# Patient Record
Sex: Male | Born: 1965 | Race: White | Hispanic: No | Marital: Single | Smoking: Never smoker
Health system: Southern US, Community
[De-identification: ages and names within clinical notes are randomized; demographics above are authoritative.]

## PROBLEM LIST (undated history)

## (undated) DIAGNOSIS — G4730 Sleep apnea, unspecified: Secondary | ICD-10-CM

## (undated) DIAGNOSIS — Z1589 Genetic susceptibility to other disease: Secondary | ICD-10-CM

## (undated) DIAGNOSIS — T8859XA Other complications of anesthesia, initial encounter: Secondary | ICD-10-CM

## (undated) DIAGNOSIS — E785 Hyperlipidemia, unspecified: Secondary | ICD-10-CM

## (undated) DIAGNOSIS — I10 Essential (primary) hypertension: Secondary | ICD-10-CM

## (undated) HISTORY — PX: TMJ ARTHROSCOPY: SHX1067

## (undated) HISTORY — PX: COLONOSCOPY: SHX174

## (undated) HISTORY — DX: Sleep apnea, unspecified: G47.30

## (undated) HISTORY — PX: EYE SURGERY: SHX253

## (undated) HISTORY — DX: Essential (primary) hypertension: I10

## (undated) HISTORY — PX: WISDOM TOOTH EXTRACTION: SHX21

---

## 2003-02-27 ENCOUNTER — Encounter: Admission: RE | Admit: 2003-02-27 | Discharge: 2003-02-27 | Payer: Self-pay | Admitting: Family Medicine

## 2003-02-27 ENCOUNTER — Encounter: Payer: Self-pay | Admitting: Family Medicine

## 2014-03-24 ENCOUNTER — Ambulatory Visit (INDEPENDENT_AMBULATORY_CARE_PROVIDER_SITE_OTHER): Payer: BC Managed Care – PPO | Admitting: Family Medicine

## 2014-03-24 ENCOUNTER — Encounter: Payer: Self-pay | Admitting: Family Medicine

## 2014-03-24 VITALS — BP 120/84 | HR 82 | Temp 98.5°F | Resp 16

## 2014-03-24 DIAGNOSIS — T63461A Toxic effect of venom of wasps, accidental (unintentional), initial encounter: Secondary | ICD-10-CM

## 2014-03-24 DIAGNOSIS — T6391XA Toxic effect of contact with unspecified venomous animal, accidental (unintentional), initial encounter: Secondary | ICD-10-CM

## 2014-03-24 DIAGNOSIS — I10 Essential (primary) hypertension: Secondary | ICD-10-CM

## 2014-03-24 DIAGNOSIS — T7840XA Allergy, unspecified, initial encounter: Secondary | ICD-10-CM

## 2014-03-24 DIAGNOSIS — R0789 Other chest pain: Secondary | ICD-10-CM

## 2014-03-24 DIAGNOSIS — T63441A Toxic effect of venom of bees, accidental (unintentional), initial encounter: Secondary | ICD-10-CM

## 2014-03-24 DIAGNOSIS — L509 Urticaria, unspecified: Secondary | ICD-10-CM

## 2014-03-24 MED ORDER — PREDNISONE 20 MG PO TABS: ORAL_TABLET | ORAL | Status: DC

## 2014-03-24 MED ORDER — RANITIDINE HCL 150 MG PO TABS
150.0000 mg | ORAL_TABLET | Freq: Once | ORAL | Status: AC
  Administered 2014-03-24: 150 mg via ORAL

## 2014-03-24 MED ORDER — DIPHENHYDRAMINE HCL 25 MG PO CAPS
50.0000 mg | ORAL_CAPSULE | Freq: Once | ORAL | Status: AC
  Administered 2014-03-24: 50 mg via ORAL

## 2014-03-24 MED ORDER — METHYLPREDNISOLONE SODIUM SUCC 125 MG IJ SOLR
125.0000 mg | Freq: Once | INTRAMUSCULAR | Status: AC
  Administered 2014-03-24: 125 mg via INTRAVENOUS

## 2014-03-24 MED ORDER — EPINEPHRINE 0.3 MG/0.3ML IJ SOAJ: 0.3000 mg | Freq: Once | INTRAMUSCULAR | Status: DC

## 2014-03-24 NOTE — Progress Notes (Signed)
Subjective:  This chart was scribed for Reginia Forts, MD by Donato Schultz, Medical Scribe. This patient was seen in Room 6 and the patient's care was started at 3:48 PM.   Patient ID: Anthony Golden, male    DOB: 1966-09-28, 48 y.o.   MRN: [id number]  03/24/2014  Insect Bite, Chest Pain and Shortness of Breath   HPI HPI Comments: Anthony Golden is a 48 y.o. male who presents to the [facility] complaining of gradually worsening SOB and feeling as though his throat is tightening that started 10 minutes ago today after the patient was stung by a bee on the left side of his neck and right hand.  He denies taking any medication PTA.  The patient states that he has an allergy to honey bee venom and was stung in the past with a significant local reaction but without anaphylaxis or respiratory distress.  He states that in the past he has only experienced swelling to the site of the bee sting.  He states that he has never experienced these symptoms in the past.  The patient denies having any other allergies.  He denies having a Epi-pen.    PCP:  Nancy Fetter  Review of Systems  Constitutional: Positive for diaphoresis.  HENT: Positive for trouble swallowing. Negative for facial swelling.   Respiratory: Positive for chest tightness and shortness of breath. Negative for cough, choking, wheezing and stridor.   Cardiovascular: Negative for palpitations.  Gastrointestinal: Negative for nausea.  Skin: Positive for color change, rash and wound. Negative for pallor.  Neurological: Negative for dizziness, light-headedness and headaches.  All other systems reviewed and are negative.   Past Medical History  Diagnosis Date  . Hypertension    Past Surgical History  Procedure Laterality Date  . Tmj arthroscopy      Allergies not on file Current Outpatient Prescriptions  Medication Sig Dispense Refill  . EPINEPHrine (EPIPEN) 0.3 mg/0.3 mL IJ SOAJ injection Inject 0.3 mLs (0.3 mg total)  into the muscle once.  2 Device  2  . predniSONE (DELTASONE) 20 MG tablet Three tablets daily x 2 days then two tablets daily x 5 days then one tablet daily x 5 days  18 tablet  0   Current Facility-Administered Medications  Medication Dose Route Frequency Provider Last Rate Last Dose  . diphenhydrAMINE (BENADRYL) capsule 50 mg  50 mg Oral Once [employer], PA-C      . ranitidine (ZANTAC) tablet 150 mg  150 mg Oral Once Theda Sers, PA-C       History   Social History  . Marital Status: Single    Spouse Name: N/A    Number of Children: N/A  . Years of Education: N/A   Occupational History  . Not on file.   Social History Main Topics  . Smoking status: Not on file  . Smokeless tobacco: Not on file  . Alcohol Use: Yes  . Drug Use: No  . Sexual Activity: Yes   Other Topics Concern  . Not on file   Social History Narrative  . No narrative on file      Objective:    BP 118/80  Pulse 99  Resp 22  SpO2 94%  Physical Exam  Nursing note and vitals reviewed. Constitutional: He is oriented to person, place, and time. He appears well-developed and well-nourished. No distress.  HENT:  Head: Normocephalic and atraumatic.  Right Ear: External ear normal.  Left Ear: External ear  normal.  Nose: Nose normal.  Mouth/Throat: Oropharynx is clear and moist and mucous membranes are normal. No oral lesions. No oropharyngeal exudate or posterior oropharyngeal edema.  Eyes: Conjunctivae and EOM are normal. Pupils are equal, round, and reactive to light.  Neck: Normal range of motion. Neck supple. No tracheal deviation present.  Cardiovascular: Normal rate, regular rhythm and normal heart sounds.  Exam reveals no gallop and no friction rub.   No murmur heard. Pulmonary/Chest: Effort normal and breath sounds normal. No accessory muscle usage or stridor. Not tachypneic. No respiratory distress. He has no decreased breath sounds. He has no wheezes. He has no rales.  Musculoskeletal:  Normal range of motion.  Neurological: He is alert and oriented to person, place, and time.  Skin: Skin is warm. Rash noted. He is diaphoretic. There is erythema.  Scattered urticarial rash on dorsal aspects of B feet and B axilla; scattered urticaria along abdomen, legs, lateral L neck.  Surrounding swelling at L lateral neck region.  Psychiatric: He has a normal mood and affect. His behavior is normal.   Gave Benadryl 50MG  PO at 3:50 PM.  Ranitidine 150 PO at 3:50 PM.  Zyrtec 10MG  PO at 3:50 PM. IV placement at 3:58 PM by Luetta Nutting.  Solumedrol 172mL iv push at 4:00 PM.    Vitals at 4:20pm:  P 99  R 22  BP 118/80  Pulse oximetry: 94%.  Patient denies SOB, throat swelling, tongue swelling, difficulties breathing.  Itching more diffusely.    Re-evaluation at 4:45pm:  Tongue without swelling; throat without swelling; lungs CTAB; no wheezing; no tachypnea.  Hives decreased; facial redness nearly resolved.   Reevaluation at 5:30pm:  Tongue without swelling; throat without swelling. Lungs CTAB; no wheezing; no tachypnea.  Hives have resolved 95%; facial redness resolved.    No results found for this or any previous visit.  Assessment & Plan:  Allergic reaction, initial encounter - Plan: diphenhydrAMINE (BENADRYL) capsule 50 mg, ranitidine (ZANTAC) tablet 150 mg, methylPREDNISolone sodium succinate (SOLU-MEDROL) 125 mg/2 mL injection 125 mg  Bee sting reaction, accidental or unintentional, initial encounter  Essential hypertension, benign   1.  Allergic Reaction to bee sting:  New.  Evaluated urgently in office due to chest tightness, SOB, hives. S/p Solumedrol 125mg  iv push in office; s/p oral Benadryl 50mg  po, Ranitidine 150mg  po, Zyrtec 10mg  po in office.  Rx for Prednisone taper, Epipen provided.  Recommend continued Zyrtec, Benadryl, Ranitidine for upcoming week.  2.  Bee sting reaction initial encounter:  New.  S/p treatment as outlined above.  Rx for Epipen provided. 3.  HTN:  stable; maintained on Lisinopril.   Meds ordered this encounter  Medications  . diphenhydrAMINE (BENADRYL) capsule 50 mg    Sig:   . ranitidine (ZANTAC) tablet 150 mg    Sig:   . methylPREDNISolone sodium succinate (SOLU-MEDROL) 125 mg/2 mL injection 125 mg    Sig:   . EPINEPHrine (EPIPEN) 0.3 mg/0.3 mL IJ SOAJ injection    Sig: Inject 0.3 mLs (0.3 mg total) into the muscle once.    Dispense:  2 Device    Refill:  2  . predniSONE (DELTASONE) 20 MG tablet    Sig: Three tablets daily x 2 days then two tablets daily x 5 days then one tablet daily x 5 days    Dispense:  18 tablet    Refill:  0    No Follow-up on file.   I personally performed the services described in this documentation, which  was scribed in my presence.  The recorded information has been reviewed and is accurate.  Reginia Forts, M.D.  [facility] [address] [place], [place]  [phone] phone [phone] fax

## 2014-03-24 NOTE — Patient Instructions (Addendum)
1.  TAKE ZYRTEC 10MG or CLARITIN 10MG ONE TABLET DAILY. 2. TAKE BENADRYL 25MG ONE TABLET FOUR TIMES DAILY AS NEEDED FOR ITCHING 3.  TAKE RANITIDINE 150MG ONE TABLET TWICE DAILY 4. IF YOU BECOME SHORT OF BREATH, HAVE THROAT SWELLING OR TONGUE SWELLING, ADMINISTER EPIPEN AND CALL 911.  Bee, Wasp, or Hornet Sting Your caregiver has diagnosed you as having an insect sting. An insect sting appears as a red lump in the skin that sometimes has a tiny hole in the center, or it may have a stinger in the center of the wound. The most common stings are from wasps, hornets and bees. Individuals have different reactions to insect stings.  A normal reaction may cause pain, swelling, and redness around the sting site.  A localized allergic reaction may cause swelling and redness that extends beyond the sting site.  A large local reaction may continue to develop over the next 12 to 36 hours.  On occasion, the reactions can be severe (anaphylactic reaction). An anaphylactic reaction may cause wheezing; difficulty breathing; chest pain; fainting; raised, itchy, red patches on the skin; a sick feeling to your stomach (nausea); vomiting; cramping; or diarrhea. If you have had an anaphylactic reaction to an insect sting in the past, you are more likely to have one again. HOME CARE INSTRUCTIONS   With bee stings, a small sac of poison is left in the wound. Brushing across this with something such as a credit card, or anything similar, will help remove this and decrease the amount of the reaction. This same procedure will not help a wasp sting as they do not leave behind a stinger and poison sac.  Apply a cold compress for 10 to 20 minutes every hour for 1 to 2 days, depending on severity, to reduce swelling and itching.  To lessen pain, a paste made of water and baking soda may be rubbed on the bite or sting and left on for 5 minutes.  To relieve itching and swelling, you may use take medication or apply medicated  creams or lotions as directed.  Only take over-the-counter or prescription medicines for pain, discomfort, or fever as directed by your caregiver.  Wash the sting site daily with soap and water. Apply antibiotic ointment on the sting site as directed.  If you suffered a severe reaction:  If you did not require hospitalization, an adult will need to stay with you for 24 hours in case the symptoms return.  You may need to wear a medical bracelet or necklace stating the allergy.  You and your family need to learn when and how to use an anaphylaxis kit or epinephrine injection.  If you have had a severe reaction before, always carry your anaphylaxis kit with you. SEEK MEDICAL CARE IF:   None of the above helps within 2 to 3 days.  The area becomes red, warm, tender, and swollen beyond the area of the bite or sting.  You have an oral temperature above 102 F (38.9 C). SEEK IMMEDIATE MEDICAL CARE IF:  You have symptoms of an allergic reaction which are:  Wheezing.  Difficulty breathing.  Chest pain.  Lightheadedness or fainting.  Itchy, raised, red patches on the skin.  Nausea, vomiting, cramping or diarrhea. ANY OF THESE SYMPTOMS MAY REPRESENT A SERIOUS PROBLEM THAT IS AN EMERGENCY. Do not wait to see if the symptoms will go away. Get medical help right away. Call your local emergency services (911 in [place] DO NOT drive yourself to the hospital.  MAKE SURE YOU:   Understand these instructions.  Will watch your condition.  Will get help right away if you are not doing well or get worse. Document Released: 09/14/2005 Document Revised: 12/07/2011 Document Reviewed: 03/01/2010 [facility] Patient Information 2015 [place], [place]. This information is not intended to replace advice given to you by your health care provider. Make sure you discuss any questions you have with your health care provider.

## 2016-01-15 DIAGNOSIS — E785 Hyperlipidemia, unspecified: Secondary | ICD-10-CM | POA: Diagnosis not present

## 2016-01-15 DIAGNOSIS — I10 Essential (primary) hypertension: Secondary | ICD-10-CM | POA: Diagnosis not present

## 2016-03-30 DIAGNOSIS — R079 Chest pain, unspecified: Secondary | ICD-10-CM | POA: Diagnosis not present

## 2016-03-30 DIAGNOSIS — I10 Essential (primary) hypertension: Secondary | ICD-10-CM | POA: Diagnosis not present

## 2016-07-20 DIAGNOSIS — E785 Hyperlipidemia, unspecified: Secondary | ICD-10-CM | POA: Diagnosis not present

## 2016-07-20 DIAGNOSIS — Z125 Encounter for screening for malignant neoplasm of prostate: Secondary | ICD-10-CM | POA: Diagnosis not present

## 2016-07-20 DIAGNOSIS — Z0000 Encounter for general adult medical examination without abnormal findings: Secondary | ICD-10-CM | POA: Diagnosis not present

## 2016-07-20 DIAGNOSIS — I10 Essential (primary) hypertension: Secondary | ICD-10-CM | POA: Diagnosis not present

## 2016-07-20 DIAGNOSIS — N50812 Left testicular pain: Secondary | ICD-10-CM | POA: Diagnosis not present

## 2016-11-02 DIAGNOSIS — M778 Other enthesopathies, not elsewhere classified: Secondary | ICD-10-CM | POA: Diagnosis not present

## 2017-04-13 DIAGNOSIS — E785 Hyperlipidemia, unspecified: Secondary | ICD-10-CM | POA: Diagnosis not present

## 2017-04-13 DIAGNOSIS — I10 Essential (primary) hypertension: Secondary | ICD-10-CM | POA: Diagnosis not present

## 2017-06-01 DIAGNOSIS — F439 Reaction to severe stress, unspecified: Secondary | ICD-10-CM | POA: Diagnosis not present

## 2017-06-01 DIAGNOSIS — I10 Essential (primary) hypertension: Secondary | ICD-10-CM | POA: Diagnosis not present

## 2017-12-02 DIAGNOSIS — E785 Hyperlipidemia, unspecified: Secondary | ICD-10-CM | POA: Diagnosis not present

## 2017-12-02 DIAGNOSIS — E669 Obesity, unspecified: Secondary | ICD-10-CM | POA: Diagnosis not present

## 2017-12-02 DIAGNOSIS — I10 Essential (primary) hypertension: Secondary | ICD-10-CM | POA: Diagnosis not present

## 2018-02-24 DIAGNOSIS — R0789 Other chest pain: Secondary | ICD-10-CM | POA: Diagnosis not present

## 2018-02-24 DIAGNOSIS — F419 Anxiety disorder, unspecified: Secondary | ICD-10-CM | POA: Diagnosis not present

## 2018-02-24 DIAGNOSIS — H532 Diplopia: Secondary | ICD-10-CM | POA: Diagnosis not present

## 2018-03-17 DIAGNOSIS — R945 Abnormal results of liver function studies: Secondary | ICD-10-CM | POA: Diagnosis not present

## 2018-04-03 DIAGNOSIS — K219 Gastro-esophageal reflux disease without esophagitis: Secondary | ICD-10-CM | POA: Diagnosis not present

## 2018-05-08 NOTE — Progress Notes (Signed)
Cardiology Office Note   Date:  05/10/2018   ID:  Anthony Golden, DOB 1966-04-10, MRN [id number]  PCP:  Donald Prose, MD  Cardiologist:   No primary care provider on file. Referring:  Donald Prose, MD  Chief Complaint  Patient presents with  . New Patient (Initial Visit)    no current chest pains, does mention numbness and tingling in left arm.      History of Present Illness: Anthony Golden is a 52 y.o. male who is referred by Donald Prose, MD for evaluation of chest pain.  The patient reports having had a stress test years ago but otherwise no past cardiac history.  About 3 months ago he had some chest discomfort.  This was happening sporadically.  He was under significant stress. His stop son died and he was going through a divorce.  He works two jobs.  He was given some Xanax and he said this actually helped with his discomfort.  It helped him sleep a little bit better.  He has very poor sleep hygiene and he said as he got a little bit better with this he says his symptoms improved.  He has not had chest pain in about 2-1/2 months.  When he did have it it was a left-sided discomfort.  It is dull.  It would last for about 10 minutes.  It was 3 out of 10.  It would occur sporadically and go away spontaneously.  There are no associated symptoms such as nausea vomiting diaphoresis.  He did have any palpitations, presyncope or syncope.  Past Medical History:  Diagnosis Date  . Hypertension   . Sleep apnea    Does not use CPAP    Past Surgical History:  Procedure Laterality Date  . TMJ ARTHROSCOPY       Current Outpatient Medications  Medication Sig Dispense Refill  . ALPRAZolam (XANAX) 0.25 MG tablet TK 1 T PO  BID  0  . lisinopril-hydrochlorothiazide (PRINZIDE,ZESTORETIC) 20-12.5 MG tablet TK 1 T PO QD  1   No current facility-administered medications for this visit.     Allergies:   Bee venom    Social History:  The patient  reports that he has never smoked. He has never  used smokeless tobacco. He reports that he drinks alcohol. He reports that he does not use drugs.   Family History:  The patient's family history includes COPD in his mother; Cancer in his mother; Heart disease in his father.    ROS:  Please see the history of present illness.   Otherwise, review of systems are positive for none.   All other systems are reviewed and negative.    PHYSICAL EXAM: VS:  BP 128/80 (BP Location: Left Arm, Patient Position: Sitting)   Pulse 95   Ht 5\' 10"  (1.778 m)   Wt 213 lb 12.8 oz (97 kg)   BMI 30.68 kg/m  , BMI Body mass index is 30.68 kg/m. GENERAL:  Well appearing HEENT:  Pupils equal round and reactive, fundi not visualized, oral mucosa unremarkable NECK:  No jugular venous distention, waveform within normal limits, carotid upstroke brisk and symmetric, no bruits, no thyromegaly LYMPHATICS:  No cervical, inguinal adenopathy LUNGS:  Clear to auscultation bilaterally BACK:  No CVA tenderness CHEST:  Unremarkable HEART:  PMI not displaced or sustained,S1 and S2 within normal limits, no S3, no S4, no clicks, no rubs, no murmurs ABD:  Flat, positive bowel sounds normal in frequency in pitch, no bruits, no  rebound, no guarding, no midline pulsatile mass, no hepatomegaly, no splenomegaly EXT:  2 plus pulses throughout, no edema, no cyanosis no clubbing SKIN:  No rashes no nodules NEURO:  Cranial nerves II through XII grossly intact, motor grossly intact throughout PSYCH:  Cognitively intact, oriented to person place and time    EKG:  EKG is ordered today. The ekg ordered today demonstrates sinus rhythm, rate 95, left axis deviation, cannot exclude old inferior MI, borderline QT prolongation.   Recent Labs: No results found for requested labs within last 8760 hours.    Lipid Panel No results found for: CHOL, TRIG, HDL, CHOLHDL, VLDL, LDLCALC, LDLDIRECT    Wt Readings from Last 3 Encounters:  05/10/18 213 lb 12.8 oz (97 kg)      Other studies  Reviewed: Additional studies/ records that were reviewed today include: Labs. Review of the above records demonstrates:  Please see elsewhere in the note.     ASSESSMENT AND PLAN:  CHEST PAIN: His pain is atypical.  However, he has significant cardiovascular risk factors.  I am going to start with a coronary calcium score.  HTN:  The blood pressure is at target. No change in medications is indicated. We will continue with therapeutic lifestyle changes (TLC).  ANXIETY:  We talked about the importance of stress management.    Current medicines are reviewed at length with the patient today.  The patient does not have concerns regarding medicines.  The following changes have been made:  no change  Labs/ tests ordered today include:   Orders Placed This Encounter  Procedures  . CT CARDIAC SCORING     Disposition:   FU with me as needed.      Signed, Minus Breeding, MD  05/10/2018 5:06 PM    [facility]

## 2018-05-10 ENCOUNTER — Ambulatory Visit: Payer: Federal, State, Local not specified - PPO | Admitting: Cardiology

## 2018-05-10 ENCOUNTER — Encounter: Payer: Self-pay | Admitting: Cardiology

## 2018-05-10 VITALS — BP 128/80 | HR 95 | Ht 70.0 in | Wt 213.8 lb

## 2018-05-10 DIAGNOSIS — I10 Essential (primary) hypertension: Secondary | ICD-10-CM

## 2018-05-10 DIAGNOSIS — R079 Chest pain, unspecified: Secondary | ICD-10-CM

## 2018-05-10 NOTE — Patient Instructions (Signed)
Medication Instructions:  Continue current medications  If you need a refill on your cardiac medications before your next appointment, please call your pharmacy.  Labwork: None Ordered   Testing/Procedures: Your physician has requested that you have a Coronary Calcium Score Test. This test is done at our [facility].   Follow-Up: Your physician wants you to follow-up in: As Needed.      Thank you for choosing [facility] at [facility]!!

## 2018-05-12 ENCOUNTER — Other Ambulatory Visit (INDEPENDENT_AMBULATORY_CARE_PROVIDER_SITE_OTHER): Payer: Federal, State, Local not specified - PPO

## 2018-05-12 DIAGNOSIS — R079 Chest pain, unspecified: Secondary | ICD-10-CM

## 2018-05-27 ENCOUNTER — Ambulatory Visit: Payer: Self-pay

## 2018-05-27 ENCOUNTER — Other Ambulatory Visit: Payer: Self-pay | Admitting: Occupational Medicine

## 2018-05-27 DIAGNOSIS — M79644 Pain in right finger(s): Secondary | ICD-10-CM

## 2018-06-03 ENCOUNTER — Inpatient Hospital Stay: Admission: RE | Admit: 2018-06-03 | Payer: Federal, State, Local not specified - PPO | Source: Ambulatory Visit

## 2018-06-03 ENCOUNTER — Ambulatory Visit (INDEPENDENT_AMBULATORY_CARE_PROVIDER_SITE_OTHER)
Admission: RE | Admit: 2018-06-03 | Discharge: 2018-06-03 | Disposition: A | Discharge status: Home / Self Care | Payer: Federal, State, Local not specified - PPO | Source: Ambulatory Visit | Attending: Cardiology | Admitting: Cardiology

## 2018-06-03 DIAGNOSIS — R079 Chest pain, unspecified: Secondary | ICD-10-CM

## 2018-06-07 ENCOUNTER — Telehealth: Payer: Self-pay | Admitting: *Deleted

## 2018-06-07 DIAGNOSIS — Z79899 Other long term (current) drug therapy: Secondary | ICD-10-CM

## 2018-06-07 DIAGNOSIS — R931 Abnormal findings on diagnostic imaging of heart and coronary circulation: Secondary | ICD-10-CM

## 2018-06-07 MED ORDER — PRAVASTATIN SODIUM 40 MG PO TABS: 40.0000 mg | ORAL_TABLET | Freq: Every evening | ORAL | 3 refills | Status: DC

## 2018-06-07 NOTE — Telephone Encounter (Signed)
GXT ordered and send to scheduler to be schedule, Rx has been sent to the pharmacy electronically. Pravastatin 40 mg #90 3 refills Fasting lipids and liver ordered and mailed to pt

## 2018-06-07 NOTE — Telephone Encounter (Signed)
-----   Message from Minus Breeding, MD sent at 06/06/2018 11:35 AM EDT ----- Called to discuss the elevated coronary calcium. Needs POET (Plain Old Exercise Treadmill).  Please call to scheduled.  MESA score is greater than 13% (intermediate risk).  I would suggest a goal LDL of less than 70 and he agrees to start a statin.  Please call in Pravastatin 40 mg po qhs.  Disp number 90 with 3 refills.  Repeat lipid profile in 8 weeks.  Send results to Donald Prose, MD

## 2018-06-08 ENCOUNTER — Telehealth (HOSPITAL_COMMUNITY): Payer: Self-pay

## 2018-06-08 NOTE — Telephone Encounter (Signed)
Encounter complete. 

## 2018-06-09 ENCOUNTER — Ambulatory Visit (HOSPITAL_COMMUNITY)
Admission: RE | Admit: 2018-06-09 | Discharge: 2018-06-09 | Disposition: A | Discharge status: Home / Self Care | Payer: Federal, State, Local not specified - PPO | Source: Ambulatory Visit | Attending: Cardiology | Admitting: Cardiology

## 2018-06-09 DIAGNOSIS — R931 Abnormal findings on diagnostic imaging of heart and coronary circulation: Secondary | ICD-10-CM | POA: Diagnosis not present

## 2018-06-10 LAB — EXERCISE TOLERANCE TEST
CHL RATE OF PERCEIVED EXERTION: 17
CSEPED: 9 min
Estimated workload: 11.6 METS
Exercise duration (sec): 53 s
MPHR: 166 {beats}/min
Peak HR: 151 {beats}/min
Percent HR: 90 %
Rest HR: 93 {beats}/min

## 2018-06-14 DIAGNOSIS — I10 Essential (primary) hypertension: Secondary | ICD-10-CM | POA: Diagnosis not present

## 2018-08-17 NOTE — Progress Notes (Signed)
Cardiology Office Note   Date:  08/18/2018   ID:  Anthony Golden, DOB 03-05-1966, MRN [id number]  PCP:  Donald Prose, MD  Cardiologist:   No primary care provider on file. Referring:  Donald Prose, MD   Chief Complaint  Patient presents with  . ELEVATED CORONARY CALCIUM      History of Present Illness: Anthony Golden is a 52 y.o. male who is referred by Donald Prose, MD for evaluation of chest pain.   When I saw him he had an elevated coronary calcium and he had a negative POET (Plain Old Exercise Treadmill).  He returns for follow-up.  He was going through the stress of the divorce which is now behind him.  He works cutting down trees.  He is no longer getting the chest discomfort that he was having.  He is not having any palpitations, presyncope or syncope.  He has not had any edema.  Unfortunately he is gained some weight.  This is above his usual winter baseline.   Past Medical History:  Diagnosis Date  . Hypertension   . Sleep apnea    Does not use CPAP    Past Surgical History:  Procedure Laterality Date  . TMJ ARTHROSCOPY       Current Outpatient Medications  Medication Sig Dispense Refill  . ALPRAZolam (XANAX) 0.25 MG tablet TK 1 T PO  BID  0  . lisinopril-hydrochlorothiazide (PRINZIDE,ZESTORETIC) 20-12.5 MG tablet TK 1 T PO QD  1  . pravastatin (PRAVACHOL) 40 MG tablet Take 1 tablet (40 mg total) by mouth every evening. 90 tablet 3   No current facility-administered medications for this visit.     Allergies:   Bee venom    Social History:  The patient  reports that he has never smoked. He has never used smokeless tobacco. He reports that he drinks alcohol. He reports that he does not use drugs.   Family History:  The patient's family history includes COPD in his mother; Cancer in his mother; Heart disease in his father.    ROS:  Please see the history of present illness.   Otherwise, review of systems are positive for none.   All other systems are  reviewed and negative.    PHYSICAL EXAM: VS:  BP (!) 144/96   Pulse 97   Ht 5\' 10"  (1.778 m)   Wt 243 lb 3.2 oz (110.3 kg)   SpO2 95%   BMI 34.90 kg/m  , BMI Body mass index is 34.9 kg/m. GENERAL:  Well appearing HEENT:  Pupils equal round and reactive, fundi not visualized, oral mucosa unremarkable NECK:  No jugular venous distention, waveform within normal limits, carotid upstroke brisk and symmetric, no bruits, no thyromegaly LYMPHATICS:  No cervical, inguinal adenopathy LUNGS:  Clear to auscultation bilaterally BACK:  No CVA tenderness CHEST:  Unremarkable HEART:  PMI not displaced or sustained,S1 and S2 within normal limits, no S3, no S4, no clicks, no rubs, no murmurs ABD:  Flat, positive bowel sounds normal in frequency in pitch, no bruits, no rebound, no guarding, no midline pulsatile mass, no hepatomegaly, no splenomegaly EXT:  2 plus pulses throughout, no edema, no cyanosis no clubbing SKIN:  No rashes no nodules NEURO:  Cranial nerves II through XII grossly intact, motor grossly intact throughout PSYCH:  Cognitively intact, oriented to person place and time    EKG:  EKG is ordered today. The ekg ordered today demonstrates sinus rhythm, rate 95, left axis deviation, cannot exclude  old inferior MI, borderline QT prolongation.   Recent Labs: No results found for requested labs within last 8760 hours.    Lipid Panel No results found for: CHOL, TRIG, HDL, CHOLHDL, VLDL, LDLCALC, LDLDIRECT    Wt Readings from Last 3 Encounters:  08/18/18 243 lb 3.2 oz (110.3 kg)  05/10/18 213 lb 12.8 oz (97 kg)      Other studies Reviewed: Additional studies/ records that were reviewed today include: Labs. Review of the above records demonstrates:  Please see elsewhere in the note.     ASSESSMENT AND PLAN:  CHEST PAIN:   The patient had atypical chest pain.  No further cardiovascular testing is suggested.  HTN:  The blood pressure is not at target.  I would like him to lose  weight and we talked very specifically about this and I think his blood pressure would come down to target with current meds.  ELEVATED CORONARY CALCIUM: We had a long discussion about this.  There was no suggestion of obstructive disease on his POET (Plain Old Exercise Treadmill).  No further testing is indicated.  He needs aggressive risk reduction.  DYSLIPIDEMIA: We discussed at length Mediterranean diet.  I previously started him on pravastatin.  I have him come back in the near future for a lipid profile.   Current medicines are reviewed at length with the patient today.  The patient does not have concerns regarding medicines.  The following changes have been made:  None  Labs/ tests ordered today include:   Orders Placed This Encounter  Procedures  . Lipid panel     Disposition:   FU with me in one year.    Signed, Minus Breeding, MD  08/18/2018 5:38 PM    [place]

## 2018-08-18 ENCOUNTER — Encounter: Payer: Self-pay | Admitting: Cardiology

## 2018-08-18 ENCOUNTER — Ambulatory Visit: Payer: Federal, State, Local not specified - PPO | Admitting: Cardiology

## 2018-08-18 VITALS — BP 144/96 | HR 97 | Ht 70.0 in | Wt 243.2 lb

## 2018-08-18 DIAGNOSIS — Z79899 Other long term (current) drug therapy: Secondary | ICD-10-CM | POA: Diagnosis not present

## 2018-08-18 NOTE — Patient Instructions (Signed)

## 2018-11-28 DIAGNOSIS — M7672 Peroneal tendinitis, left leg: Secondary | ICD-10-CM | POA: Diagnosis not present

## 2018-11-28 DIAGNOSIS — M84375A Stress fracture, left foot, initial encounter for fracture: Secondary | ICD-10-CM | POA: Diagnosis not present

## 2018-12-05 DIAGNOSIS — M84375D Stress fracture, left foot, subsequent encounter for fracture with routine healing: Secondary | ICD-10-CM | POA: Diagnosis not present

## 2018-12-05 DIAGNOSIS — Z125 Encounter for screening for malignant neoplasm of prostate: Secondary | ICD-10-CM | POA: Diagnosis not present

## 2018-12-05 DIAGNOSIS — E669 Obesity, unspecified: Secondary | ICD-10-CM | POA: Diagnosis not present

## 2018-12-05 DIAGNOSIS — E781 Pure hyperglyceridemia: Secondary | ICD-10-CM | POA: Diagnosis not present

## 2018-12-05 DIAGNOSIS — E785 Hyperlipidemia, unspecified: Secondary | ICD-10-CM | POA: Diagnosis not present

## 2018-12-05 DIAGNOSIS — Z0000 Encounter for general adult medical examination without abnormal findings: Secondary | ICD-10-CM | POA: Diagnosis not present

## 2018-12-05 DIAGNOSIS — F419 Anxiety disorder, unspecified: Secondary | ICD-10-CM | POA: Diagnosis not present

## 2018-12-05 DIAGNOSIS — I10 Essential (primary) hypertension: Secondary | ICD-10-CM | POA: Diagnosis not present

## 2018-12-19 DIAGNOSIS — M84375D Stress fracture, left foot, subsequent encounter for fracture with routine healing: Secondary | ICD-10-CM | POA: Diagnosis not present

## 2018-12-22 IMAGING — CT CT HEART SCORING
2 series · 16 of 20 positions shown, 18 images · non-contrast
Comparison: None.

EXAM:
OVER-READ INTERPRETATION  CT CHEST

The following report is an over-read performed by radiologist Dr.
Aphiwe J V Rensburg [REDACTED] on 06/04/2018. This over-read
does not include interpretation of cardiac or coronary anatomy or
pathology. The cardiac/coronary CT interpretation by the
cardiologist is attached.
CLINICAL DATA: Risk stratification
Coronary Calcium Score
TECHNIQUE: The patient was scanned on a Siemens Somatom 64 slice scanner. Axial
non-contrast 3mm slices were carried out through the heart. The data
set was analyzed on a dedicated work station and scored using the
Agatson method.

[Series 2: casc 3.0 i36f 2 bestdiast 67 % · axial · 0.44mm/px · z∈[-279,-174]mm · 8 of 45 slices shown, 10 images]
[im 5/45  vessel]
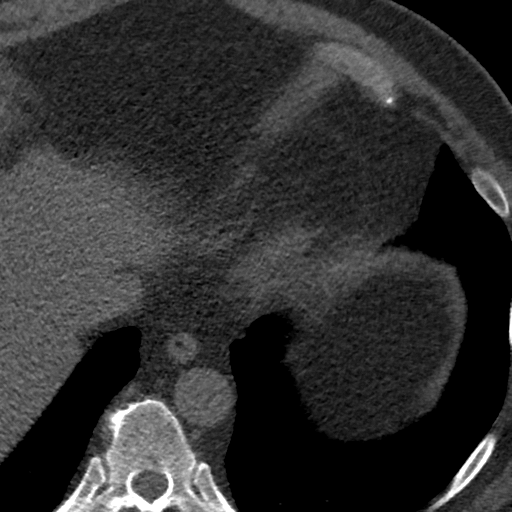
[im 5/45  lung]
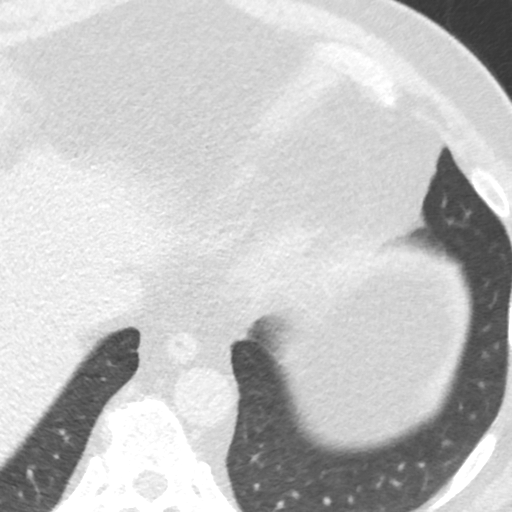
[im 10/45  vessel]
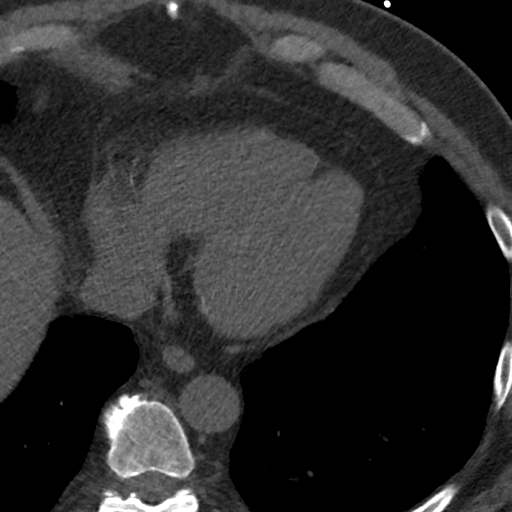
[im 15/45  vessel]
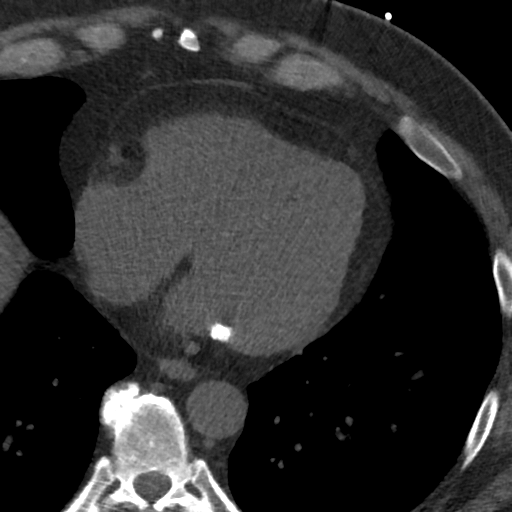
[im 20/45  vessel]
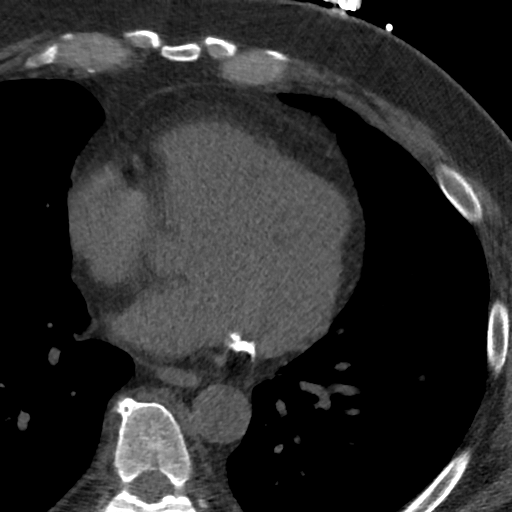
[im 25/45  vessel]
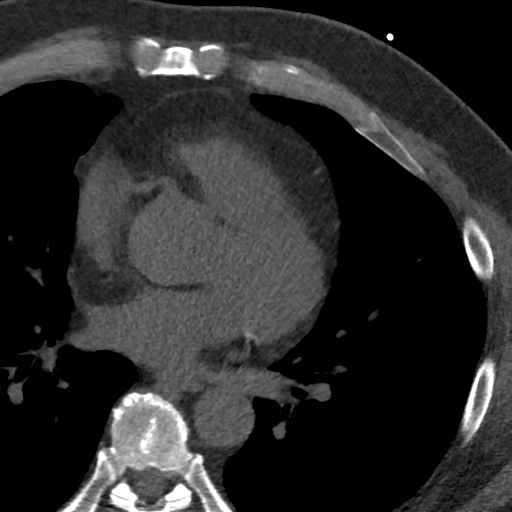
[im 25/45  lung]
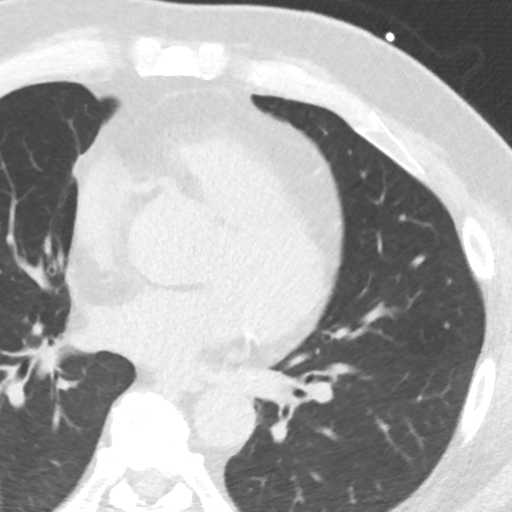
[im 30/45  vessel]
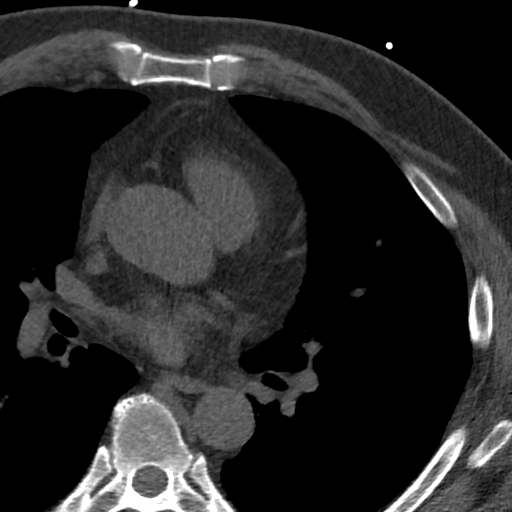
[im 35/45  vessel]
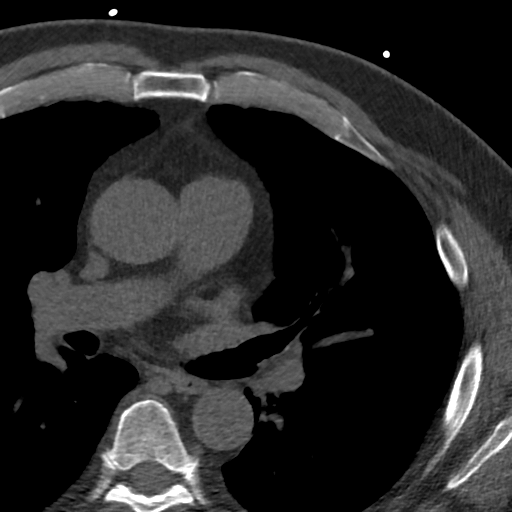
[im 40/45  vessel]
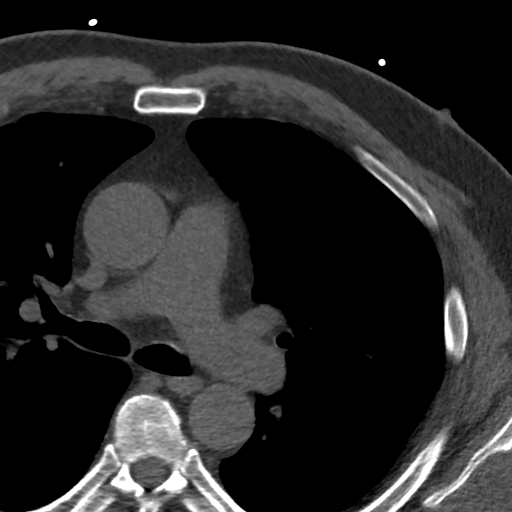

[Series 4: lung st 67 % · axial · 0.72mm/px · z∈[-279,-174]mm · 8 of 45 slices shown]
[im 5/45  lung]
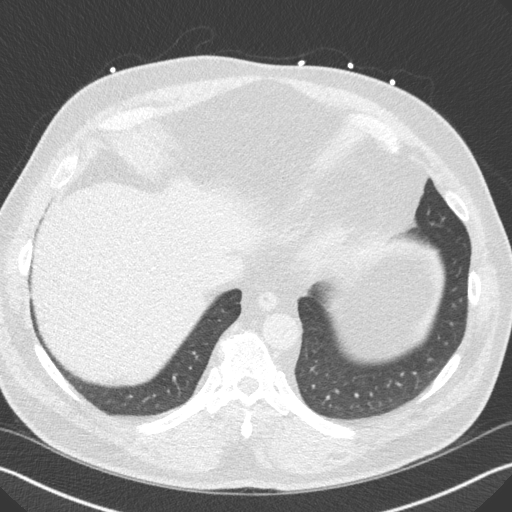
[im 10/45  lung]
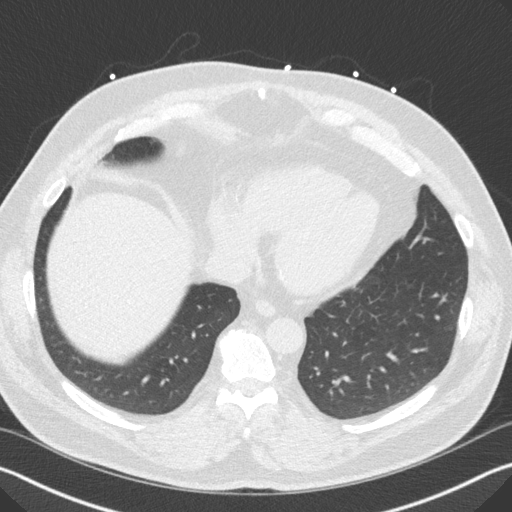
[im 15/45  lung]
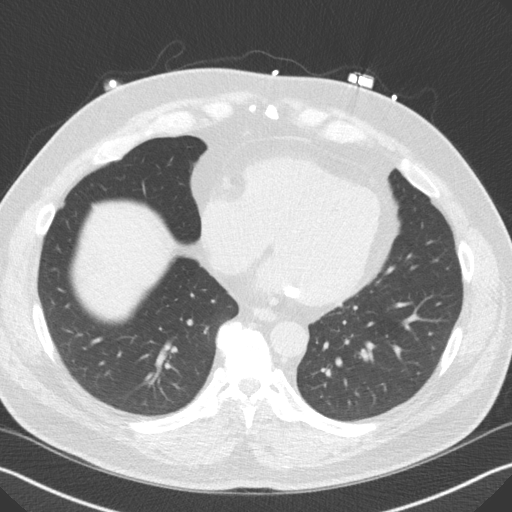
[im 20/45  lung]
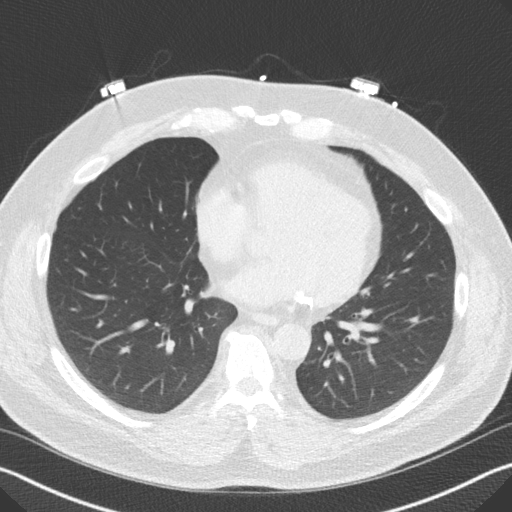
[im 25/45  lung]
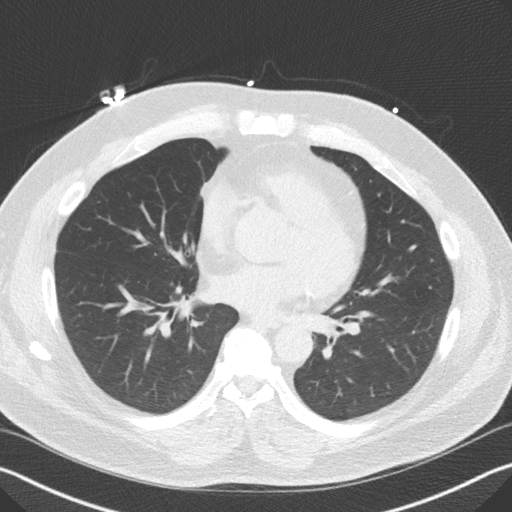
[im 30/45  lung]
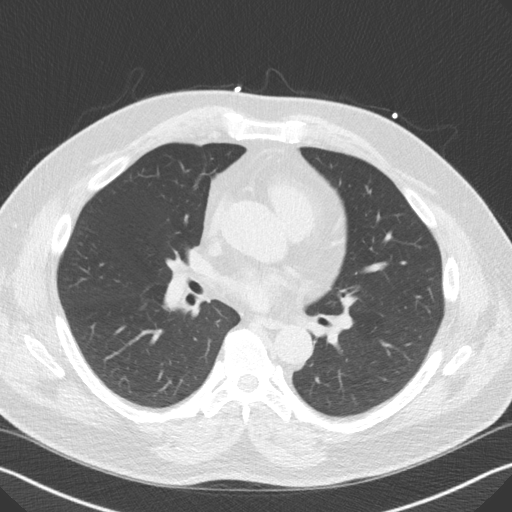
[im 35/45  lung]
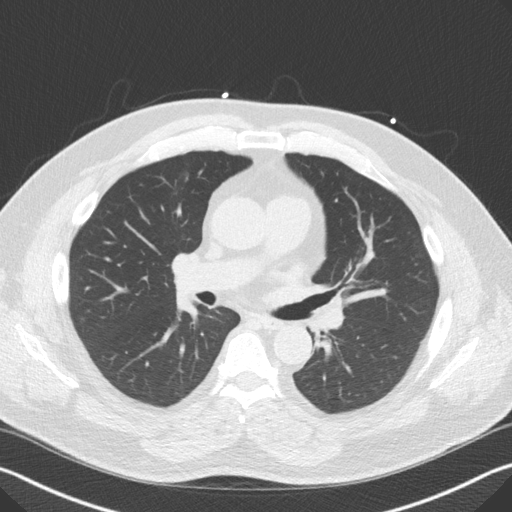
[im 40/45  lung]
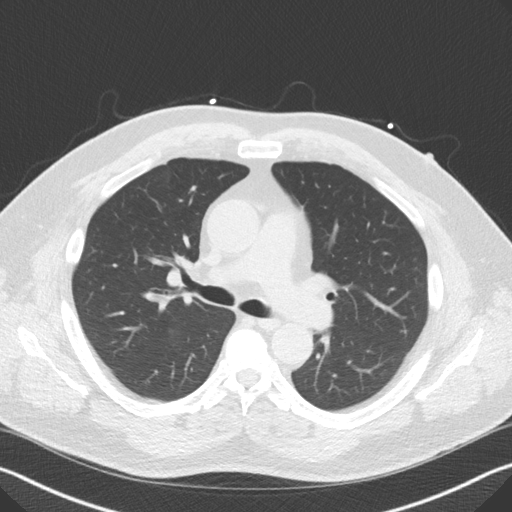

[16 of 20 positions shown; findings below may reference images not displayed]

FINDINGS: The visualized portions of the lower lung fields show no suspicious
nodules, masses, or infiltrates. The visualized portions of the
mediastinum and chest wall are unremarkable.
IMPRESSION: No significant non-cardiac abnormality in visualized portion of the
thorax.
FINDINGS: Non-cardiac: No significant non cardiac findings on limited lung and
soft tissue windows. See separate report from [REDACTED].

Ascending Aorta: Normal diameter 3.7 cm

Pericardium: Normal

Coronary arteries: Calcium noted in proximal and mid LAD, Distal
RCA. There was significant mitral annular calcification
IMPRESSION: Coronary calcium score of 232. This was 91 st percentile for age and
sex matched control.

Tonttu Sepp

## 2019-01-02 DIAGNOSIS — M84375D Stress fracture, left foot, subsequent encounter for fracture with routine healing: Secondary | ICD-10-CM | POA: Diagnosis not present

## 2019-01-24 DIAGNOSIS — E7800 Pure hypercholesterolemia, unspecified: Secondary | ICD-10-CM | POA: Diagnosis not present

## 2019-02-21 ENCOUNTER — Telehealth: Payer: Self-pay | Admitting: Cardiology

## 2019-02-21 NOTE — Telephone Encounter (Signed)
  Patient states that he had discussed going back on his cpap at last visit. He says he has already had a sleep study and just needs to get a cpap machine. He needs a new order for one.

## 2019-02-21 NOTE — Telephone Encounter (Signed)
LMTCB

## 2019-02-21 NOTE — Telephone Encounter (Signed)
Pt reports that Dr. Rex Kras that has managed his CPAP is retired and he does not have anyone to manage for him... he needs new order for new machine since his other is not working anymore... he has not been using it for quite a while.. last sleep study was 5 years ago... I asked him if he is seeing someone in Pulmonary and he is not and has not tried Dr. Eddie Dibbles office to see who has taken over for him.. I urged him to try and call his office since they will have the pertinent imformation needed for the machine.. he says he has discussed this with Dr. Percival Spanish in the oast and says he was certain he could call Dr. Percival Spanish for this need. I advised him I will forward to him for his recommendations and possibility of someone managing this for him in our practice.

## 2019-02-21 NOTE — Telephone Encounter (Signed)
Please refer to Dr. Claiborne Billings to assume management of his sleep apnea.

## 2019-02-22 NOTE — Telephone Encounter (Signed)
Ok to set up either office evaluation or virtual visit with me; try to get records from Dr. Eddie Dibbles office and data regarding his prior sleep study prior to the evaluation.

## 2019-02-22 NOTE — Telephone Encounter (Signed)
Pt agreed to referral to Dr. Claiborne Billings to manage his CPAP. Will forward msg.

## 2019-02-24 NOTE — Telephone Encounter (Signed)
We have opening for sleep clinic on August 5th at 8:20. Can we please schedule this [place]?   Also Lovena Le, can we reach out and get these records for Dr.Kelly?  Thank you!

## 2019-03-30 ENCOUNTER — Telehealth: Payer: Self-pay | Admitting: Cardiovascular Disease

## 2019-03-30 NOTE — Telephone Encounter (Signed)
Records requested from PCP office and are being faxed today.

## 2019-03-30 NOTE — Telephone Encounter (Signed)
Spoke to medical records. Records for pt are being faxed to our office for pt appt on 05/03/19 with Dr. Claiborne Billings.

## 2019-04-07 DIAGNOSIS — K5730 Diverticulosis of large intestine without perforation or abscess without bleeding: Secondary | ICD-10-CM | POA: Diagnosis not present

## 2019-04-07 DIAGNOSIS — D125 Benign neoplasm of sigmoid colon: Secondary | ICD-10-CM | POA: Diagnosis not present

## 2019-04-07 DIAGNOSIS — K640 First degree hemorrhoids: Secondary | ICD-10-CM | POA: Diagnosis not present

## 2019-04-07 DIAGNOSIS — D124 Benign neoplasm of descending colon: Secondary | ICD-10-CM | POA: Diagnosis not present

## 2019-04-07 DIAGNOSIS — Z800 Family history of malignant neoplasm of digestive organs: Secondary | ICD-10-CM | POA: Diagnosis not present

## 2019-04-07 DIAGNOSIS — Z1211 Encounter for screening for malignant neoplasm of colon: Secondary | ICD-10-CM | POA: Diagnosis not present

## 2019-05-02 NOTE — Telephone Encounter (Signed)
Records have been received from PCP--printed from [place]. Paper chart requested by Wilhelmina Mcardle in med rec. She stated may not come until Friday, but will have for Dr. Claiborne Billings to review when it comes.  Mariann Laster, CMA and Almyra Free, LPN have been informed.

## 2019-05-03 ENCOUNTER — Ambulatory Visit: Payer: Federal, State, Local not specified - PPO | Admitting: Cardiovascular Disease

## 2019-06-07 ENCOUNTER — Other Ambulatory Visit (HOSPITAL_COMMUNITY): Payer: Self-pay | Admitting: Licensed Clinical Social Worker

## 2019-06-08 ENCOUNTER — Other Ambulatory Visit: Payer: Self-pay | Admitting: Behavioral Health

## 2019-06-08 ENCOUNTER — Inpatient Hospital Stay (HOSPITAL_COMMUNITY)
Admission: RE | Admit: 2019-06-08 | Discharge: 2019-06-11 | DRG: 881 | Disposition: A | Discharge status: Home / Self Care | Payer: Federal, State, Local not specified - PPO | Attending: Psychiatry | Admitting: Psychiatry

## 2019-06-08 ENCOUNTER — Encounter (HOSPITAL_COMMUNITY): Payer: Self-pay

## 2019-06-08 ENCOUNTER — Other Ambulatory Visit: Payer: Self-pay

## 2019-06-08 DIAGNOSIS — F322 Major depressive disorder, single episode, severe without psychotic features: Secondary | ICD-10-CM | POA: Diagnosis not present

## 2019-06-08 DIAGNOSIS — Z20828 Contact with and (suspected) exposure to other viral communicable diseases: Secondary | ICD-10-CM | POA: Diagnosis not present

## 2019-06-08 DIAGNOSIS — E785 Hyperlipidemia, unspecified: Secondary | ICD-10-CM | POA: Diagnosis present

## 2019-06-08 DIAGNOSIS — M1990 Unspecified osteoarthritis, unspecified site: Secondary | ICD-10-CM | POA: Diagnosis not present

## 2019-06-08 DIAGNOSIS — R45851 Suicidal ideations: Secondary | ICD-10-CM | POA: Diagnosis present

## 2019-06-08 DIAGNOSIS — G4730 Sleep apnea, unspecified: Secondary | ICD-10-CM | POA: Diagnosis present

## 2019-06-08 DIAGNOSIS — I509 Heart failure, unspecified: Secondary | ICD-10-CM | POA: Diagnosis present

## 2019-06-08 DIAGNOSIS — G4700 Insomnia, unspecified: Secondary | ICD-10-CM | POA: Diagnosis not present

## 2019-06-08 DIAGNOSIS — F329 Major depressive disorder, single episode, unspecified: Principal | ICD-10-CM | POA: Diagnosis present

## 2019-06-08 DIAGNOSIS — E781 Pure hyperglyceridemia: Secondary | ICD-10-CM | POA: Diagnosis not present

## 2019-06-08 DIAGNOSIS — K219 Gastro-esophageal reflux disease without esophagitis: Secondary | ICD-10-CM | POA: Diagnosis present

## 2019-06-08 LAB — CBC
HCT: 46.2 % (ref 39.0–52.0)
Hemoglobin: 15.8 g/dL (ref 13.0–17.0)
MCH: 32.4 pg (ref 26.0–34.0)
MCHC: 34.2 g/dL (ref 30.0–36.0)
MCV: 94.7 fL (ref 80.0–100.0)
Platelets: 269 10*3/uL (ref 150–400)
RBC: 4.88 MIL/uL (ref 4.22–5.81)
RDW: 12.3 % (ref 11.5–15.5)
WBC: 8.1 10*3/uL (ref 4.0–10.5)
nRBC: 0 % (ref 0.0–0.2)

## 2019-06-08 LAB — ETHANOL: Alcohol, Ethyl (B): <10 mg/dL (ref <10)

## 2019-06-08 LAB — TSH: TSH: 3.599 u[IU]/mL (ref 0.350–4.500)

## 2019-06-08 LAB — COMPREHENSIVE METABOLIC PANEL
ALT: 146 U/L — ABNORMAL HIGH (ref 0–44)
AST: 188 U/L — ABNORMAL HIGH (ref 15–41)
Albumin: 4.1 g/dL (ref 3.5–5.0)
Alkaline Phosphatase: 55 U/L (ref 38–126)
Anion gap: 14 (ref 5–15)
BUN: 11 mg/dL (ref 6–20)
CO2: 23 mmol/L (ref 22–32)
Calcium: 9 mg/dL (ref 8.9–10.3)
Chloride: 99 mmol/L (ref 98–111)
Creatinine, Ser: 0.78 mg/dL (ref 0.61–1.24)
GFR calc Af Amer: >60 mL/min (ref >60)
GFR calc non Af Amer: >60 mL/min (ref >60)
Glucose, Bld: 94 mg/dL (ref 70–99)
Potassium: 3.8 mmol/L (ref 3.5–5.1)
Sodium: 136 mmol/L (ref 135–145)
Total Bilirubin: 1.2 mg/dL (ref 0.3–1.2)
Total Protein: 7.2 g/dL (ref 6.5–8.1)

## 2019-06-08 LAB — LIPID PANEL
Cholesterol: 162 mg/dL (ref 0–200)
HDL: 30 mg/dL — ABNORMAL LOW (ref >40)
LDL Cholesterol: UNDETERMINED mg/dL (ref 0–99)
Total CHOL/HDL Ratio: 5.4 RATIO
Triglycerides: 746 mg/dL — ABNORMAL HIGH (ref <150)
VLDL: UNDETERMINED mg/dL (ref 0–40)

## 2019-06-08 LAB — BRAIN NATRIURETIC PEPTIDE: B Natriuretic Peptide: 31.4 pg/mL (ref 0.0–100.0)

## 2019-06-08 LAB — SARS CORONAVIRUS 2 (TAT 6-24 HRS): SARS Coronavirus 2: NEGATIVE

## 2019-06-08 MED ORDER — SERTRALINE HCL 25 MG PO TABS
25.0000 mg | ORAL_TABLET | Freq: Every day | ORAL | Status: DC
  Administered 2019-06-08 – 2019-06-10 (×3): 25 mg via ORAL
  Filled 2019-06-08 (×5): qty 1

## 2019-06-08 MED ORDER — HYDROCHLOROTHIAZIDE 12.5 MG PO CAPS
12.5000 mg | ORAL_CAPSULE | Freq: Every day | ORAL | Status: DC
  Administered 2019-06-08 – 2019-06-11 (×4): 12.5 mg via ORAL
  Filled 2019-06-08 (×6): qty 1

## 2019-06-08 MED ORDER — ACETAMINOPHEN 325 MG PO TABS: 650.0000 mg | ORAL_TABLET | Freq: Four times a day (QID) | ORAL | Status: DC | PRN

## 2019-06-08 MED ORDER — PRAVASTATIN SODIUM 40 MG PO TABS
40.0000 mg | ORAL_TABLET | Freq: Every day | ORAL | Status: DC
  Administered 2019-06-08: 40 mg via ORAL
  Filled 2019-06-08 (×2): qty 1

## 2019-06-08 MED ORDER — HYDROXYZINE HCL 25 MG PO TABS
25.0000 mg | ORAL_TABLET | Freq: Four times a day (QID) | ORAL | Status: DC | PRN
  Administered 2019-06-08 – 2019-06-09 (×2): 25 mg via ORAL
  Filled 2019-06-08 (×2): qty 1

## 2019-06-08 MED ORDER — ALUM & MAG HYDROXIDE-SIMETH 200-200-20 MG/5ML PO SUSP: 30.0000 mL | ORAL | Status: DC | PRN

## 2019-06-08 MED ORDER — TRAZODONE HCL 50 MG PO TABS
25.0000 mg | ORAL_TABLET | Freq: Every evening | ORAL | Status: DC | PRN
  Administered 2019-06-08 – 2019-06-09 (×2): 25 mg via ORAL
  Filled 2019-06-08 (×2): qty 1

## 2019-06-08 MED ORDER — LISINOPRIL 20 MG PO TABS
20.0000 mg | ORAL_TABLET | Freq: Every day | ORAL | Status: DC
  Administered 2019-06-08 – 2019-06-11 (×4): 20 mg via ORAL
  Filled 2019-06-08 (×8): qty 1

## 2019-06-08 MED ORDER — PANTOPRAZOLE SODIUM 40 MG PO TBEC
40.0000 mg | DELAYED_RELEASE_TABLET | Freq: Every day | ORAL | Status: DC
  Administered 2019-06-08 – 2019-06-11 (×4): 40 mg via ORAL
  Filled 2019-06-08 (×7): qty 1

## 2019-06-08 NOTE — BHH Suicide Risk Assessment (Signed)
[facility] Admission Suicide Risk Assessment   Nursing information obtained from:  Patient Demographic factors:  Male, Divorced or widowed, Caucasian, Living alone Current Mental Status:  Self-harm thoughts, Intention to act on suicide plan Loss Factors:  Loss of significant relationship(divorced 93yrs dog passed 2wks ago) Historical Factors:  NA Risk Reduction Factors:  Employed  Total Time spent with patient: 30 minutes Principal Problem: <principal problem not specified> Diagnosis:  Active Problems:   MDD (major depressive disorder)  Subjective Data: Patient is seen and examined.  Patient is a 53 year old male with essentially negative past psychiatric history and a past medical history significant for congestive heart failure, hypertension, sleep apnea and osteoarthritis who presented to the behavioral health hospital when his coworkers called for safety check at his home.  He stated he felt like his life was spiraling out of control.  The patient stated recent stressors had been going through a divorce over the last 2 years after 73 years of marriage, being alone, caring for his ill mother, and just overall having helplessness, hopelessness and worthlessness.  When the police went for the safety check he admitted to suicidal ideation, and apparently had a weapon in the home and was thinking about putting the gun to his head.  He stated that he had told his coworkers about his suicidal thoughts and they were significantly worried about him.  Also he had a dog that he found dead approximately 1 to 2 weeks prior to admission.  He also mentioned that his stepson had committed suicide approximately a year ago May.  He was admitted to the hospital for evaluation and stabilization.  Continued Clinical Symptoms:    The "Alcohol Use Disorders Identification Test", Guidelines for Use in Primary Care, Second Edition.  World Pharmacologist [facility]). Score between 0-7:  no or low risk or alcohol related  problems. Score between 8-15:  moderate risk of alcohol related problems. Score between 16-19:  high risk of alcohol related problems. Score 20 or above:  warrants further diagnostic evaluation for alcohol dependence and treatment.   CLINICAL FACTORS:   Depression:   Anhedonia Hopelessness Impulsivity Insomnia   Musculoskeletal: Strength & Muscle Tone: within normal limits Gait & Station: normal Patient leans: N/A  Psychiatric Specialty Exam: Physical Exam  Nursing note and vitals reviewed. Constitutional: He is oriented to person, place, and time. He appears well-developed and well-nourished.  HENT:  Head: Normocephalic and atraumatic.  Respiratory: Effort normal.  Neurological: He is alert and oriented to person, place, and time.    ROS  Blood pressure (!) 128/99, pulse 93, temperature 98 F (36.7 C), temperature source Oral, resp. rate 18, SpO2 100 %.There is no height or weight on file to calculate BMI.  General Appearance: Casual  Eye Contact:  Fair  Speech:  Normal Rate  Volume:  Decreased  Mood:  Depressed  Affect:  Congruent  Thought Process:  Coherent and Descriptions of Associations: Intact  Orientation:  Full (Time, Place, and Person)  Thought Content:  Logical  Suicidal Thoughts:  Yes.  with intent/plan  Homicidal Thoughts:  No  Memory:  Immediate;   Fair Recent;   Fair Remote;   Fair  Judgement:  Intact  Insight:  Fair  Psychomotor Activity:  Psychomotor Retardation  Concentration:  Concentration: Fair and Attention Span: Fair  Recall:  [employer] of Knowledge:  Fair  Language:  Good  Akathisia:  Negative  Handed:  Right  AIMS (if indicated):     Assets:  Desire for Improvement  Housing Resilience  ADL's:  Intact  Cognition:  WNL  Sleep:         COGNITIVE FEATURES THAT CONTRIBUTE TO RISK:  None    SUICIDE RISK:   Moderate:  Frequent suicidal ideation with limited intensity, and duration, some specificity in terms of plans, no associated  intent, good self-control, limited dysphoria/symptomatology, some risk factors present, and identifiable protective factors, including available and accessible social support.  PLAN OF CARE: Patient is seen and examined.  Patient is a 53 year old male with the above-stated past psychiatric history who presented for evaluation to the behavioral health hospital is found to be significantly depressed and suicidal.  He will be admitted to the hospital.  He will be integrated into the milieu.  He will be encouraged to attend groups.  Given his heart failure I believe the safest and most effective medication for him would be Zoloft.  We will start at 25 mg p.o. daily and titrate during the course the hospitalization.  He also stated that he dreams significantly, and has a hard time sleeping.  He had previously received a short-term prescription of Xanax from his cardiologist, but that made him feel significantly foggy.  We will initially try trazodone but at low-dose 25 mg p.o. nightly to see if that assists with his sleep.  He will also have available hydroxyzine 25 mg p.o. every 6 hours as needed anxiety.  He did endorse a moderate degree of increase in his alcohol intake on the weekends, but nothing basically during the week.  No drugs as well.  He does live alone, and he does have a significant history of a stepson committing suicide approximately a year ago.  He is at significant risk for self-harm.  His lisinopril and hydrochlorothiazide will be continued as well as his pravastatin.  We will obtain daily weights to make sure that his heart failure is controlled, and we will check a brain natruretic peptide to make sure it is.  He will also have all of her standard labs including a TSH.  He does have a history of sleep apnea, but has not been fitted for a mask yet.  We will monitor this during the course the hospitalization.  I certify that inpatient services furnished can reasonably be expected to improve the  patient's condition.   Sharma Covert, MD 06/08/2019, 3:08 PM

## 2019-06-08 NOTE — Tx Team (Signed)
Initial Treatment Plan 06/08/2019 12:19 PM [name] [id number]    PATIENT STRESSORS: Loss of dog Other: recently divorced   PATIENT STRENGTHS: Ability for insight Communication skills Motivation for treatment/growth   PATIENT IDENTIFIED PROBLEMS: Ineffective coping skills  Lack of support system  Poor insight                 DISCHARGE CRITERIA:  Improved stabilization in mood, thinking, and/or behavior Motivation to continue treatment in a less acute level of care  PRELIMINARY DISCHARGE PLAN: Outpatient therapy Return to previous living arrangement Return to previous work or school arrangements  PATIENT/FAMILY INVOLVEMENT: This treatment plan has been presented to and reviewed with the patient, Anthony Golden, and/or family member.  The patient and family have been given the opportunity to ask questions and make suggestions.  Aleen Sells, RN 06/08/2019, 12:19 PM

## 2019-06-08 NOTE — Progress Notes (Signed)
D: Pt alert and oriented. Pt affect/mood is anxious/depressed. Pt reports that he called out from work which he never does so they sent the police to do a wellness check. Pt shares that he is recently divorced of 2 yrs and that one of his dogs passed 2 wks ago. Pt tears up while speaking about finding dog dead in hallway of home after returning from work. Pt shares that he came here because he felt as though things where spiraling out of control. Pt endorses feelings of loneliness and isolation. Pt states that he feels safe while here, however if he had not have come he would have gone outback and shot himself. Pt denies experiencing any pain, SI/HI, or AVH at this time.   Skin assessment preformed. Pt has bug bites on R leg, middle of chx, and R hip.  A: Pt received admission education/information. Pt has been oriented to the unit and unit rules. Support and encouragement provided. Frequent verbal contact made. Routine safety checks conducted q15 minutes.   R: Pt verbally contracts for safety at this time. Pt interacts well with staff on the unit. Pt remains safe at this time. Will continue to monitor.

## 2019-06-08 NOTE — H&P (Signed)
Psychiatric Admission Assessment Adult  Patient Identification: Anthony Golden MRN:  [id number] Date of Evaluation:  06/08/2019 Chief Complaint:  MDD Principal Diagnosis: <principal problem not specified> Diagnosis:  Active Problems:   MDD (major depressive disorder)  History of Present Illness: Patient is seen and examined.  Patient is a 53 year old male with essentially negative past psychiatric history and a past medical history significant for congestive heart failure, hypertension, sleep apnea and osteoarthritis who presented to the behavioral health hospital when his coworkers called for safety check at his home.  He stated he felt like his life was spiraling out of control.  The patient stated recent stressors had been going through a divorce over the last 2 years after 78 years of marriage, being alone, caring for his ill mother, and just overall having helplessness, hopelessness and worthlessness.  When the police went for the safety check he admitted to suicidal ideation, and apparently had a weapon in the home and was thinking about putting the gun to his head.  He stated that he had told his coworkers about his suicidal thoughts and they were significantly worried about him.  Also he had a dog that he found dead approximately 1 to 2 weeks prior to admission.  He also mentioned that his stepson had committed suicide approximately a year ago May.  He was admitted to the hospital for evaluation and stabilization.  Associated Signs/Symptoms: Depression Symptoms:  depressed mood, anhedonia, insomnia, psychomotor retardation, fatigue, feelings of worthlessness/guilt, difficulty concentrating, hopelessness, suicidal thoughts with specific plan, loss of energy/fatigue, disturbed sleep, weight gain, (Hypo) Manic Symptoms:  Impulsivity, Anxiety Symptoms:  Excessive Worry, Psychotic Symptoms:  denied PTSD Symptoms: Negative Total Time spent with patient: 30 minutes  Past Psychiatric  History: Patient admitted to having gone to his employee assistance program for counseling during his divorce.  It was basically marital therapy.  He is only received Xanax as a PRN for short-term prescription in the past.  No previous admissions, no previous formal treatment.  Is the patient at risk to self? Yes.    Has the patient been a risk to self in the past 6 months? No.  Has the patient been a risk to self within the distant past? No.  Is the patient a risk to others? No.  Has the patient been a risk to others in the past 6 months? No.  Has the patient been a risk to others within the distant past? No.   Prior Inpatient Therapy: Prior Inpatient Therapy: No Prior Outpatient Therapy: Prior Outpatient Therapy: No Does patient have an ACCT team?: No Does patient have Intensive In-House Services?  : No Does patient have [facility] services? : No Does patient have P4[facility] services?: No  Alcohol Screening: Patient refused Alcohol Screening Tool: Yes 1. How often do you have a drink containing alcohol?: Monthly or less 2. How many drinks containing alcohol do you have on a typical day when you are drinking?: 1 or 2 3. How often do you have six or more drinks on one occasion?: Never AUDIT-C Score: 1 Alcohol Brief Interventions/Follow-up: Alcohol Education Substance Abuse History in the last 12 months:  No. Consequences of Substance Abuse: Negative Previous Psychotropic Medications: No  Psychological Evaluations: No  Past Medical History:  Past Medical History:  Diagnosis Date  . Hypertension   . Sleep apnea    Does not use CPAP    Past Surgical History:  Procedure Laterality Date  . TMJ ARTHROSCOPY     Family History:  Family  History  Problem Relation Age of Onset  . Cancer Mother        Lung cancer and colon cancer  . COPD Mother   . Heart disease Father        Died suddenly age 76.     Family Psychiatric  History: He had a stepson committed suicide approximately 15 months  ago. Tobacco Screening: Have you used any form of tobacco in the last 30 days? (Cigarettes, Smokeless Tobacco, Cigars, and/or Pipes): No Social History:  Social History   Substance and Sexual Activity  Alcohol Use Yes   Comment: Pt report occasianly drinking 1/2 beers per month     Social History   Substance and Sexual Activity  Drug Use No    Additional Social History: Marital status: Divorced    Pain Medications: See MARs Prescriptions: See MARs Over the Counter: See MARs History of alcohol / drug use?: Yes Name of Substance 1: Alcohol 1 - Age of First Use: unknown 1 - Amount (size/oz): 3-4 12 oz beers 1 - Frequency: Weekends 1 - Duration: ongoing 1 - Last Use / Amount: 06/05/2019                  Allergies:   Allergies  Allergen Reactions  . Bee Venom Hives   Lab Results: No results found for this or any previous visit (from the past 48 hour(s)).  Blood Alcohol level:  No results found for: [facility]  Metabolic Disorder Labs:  No results found for: HGBA1C, MPG No results found for: PROLACTIN No results found for: CHOL, TRIG, HDL, CHOLHDL, VLDL, LDLCALC  Current Medications: Current Facility-Administered Medications  Medication Dose Route Frequency Provider Last Rate Last Dose  . acetaminophen (TYLENOL) tablet 650 mg  650 mg Oral Q6H PRN Mordecai Maes, NP      . alum & mag hydroxide-simeth (MAALOX/MYLANTA) 200-200-20 MG/5ML suspension 30 mL  30 mL Oral Q4H PRN Mordecai Maes, NP      . hydrochlorothiazide (MICROZIDE) capsule 12.5 mg  12.5 mg Oral Daily Sharma Covert, MD   12.5 mg at 06/08/19 1256  . lisinopril (ZESTRIL) tablet 20 mg  20 mg Oral Daily Sharma Covert, MD   20 mg at 06/08/19 1256  . pravastatin (PRAVACHOL) tablet 40 mg  40 mg Oral q1800 Sharma Covert, MD       PTA Medications: Medications Prior to Admission  Medication Sig Dispense Refill Last Dose  . ALPRAZolam (XANAX) 0.5 MG tablet Take 0.5 mg by mouth 2 (two) times daily as  needed for anxiety.     Marland Kitchen lisinopril-hydrochlorothiazide (PRINZIDE,ZESTORETIC) 20-12.5 MG tablet Take 1 tablet by mouth daily.   1   . pravastatin (PRAVACHOL) 40 MG tablet Take 1 tablet (40 mg total) by mouth every evening. 90 tablet 3     Musculoskeletal: Strength & Muscle Tone: within normal limits Gait & Station: normal Patient leans: N/A  Psychiatric Specialty Exam: Physical Exam  Nursing note and vitals reviewed. Constitutional: He is oriented to person, place, and time. He appears well-developed and well-nourished.  HENT:  Head: Normocephalic and atraumatic.  Respiratory: Effort normal.  Neurological: He is alert and oriented to person, place, and time.    ROS  Blood pressure (!) 128/99, pulse 93, temperature 98 F (36.7 C), temperature source Oral, resp. rate 18, SpO2 100 %.There is no height or weight on file to calculate BMI.  General Appearance: Casual  Eye Contact:  Minimal  Speech:  Normal Rate  Volume:  Decreased  Mood:  Depressed  Affect:  Congruent  Thought Process:  Coherent and Descriptions of Associations: Intact  Orientation:  Full (Time, Place, and Person)  Thought Content:  Logical  Suicidal Thoughts:  Yes.  with intent/plan  Homicidal Thoughts:  No  Memory:  Immediate;   Fair Recent;   Fair Remote;   Fair  Judgement:  Intact  Insight:  Fair  Psychomotor Activity:  Psychomotor Retardation  Concentration:  Concentration: Fair and Attention Span: Fair  Recall:  [employer] of Knowledge:  Good  Language:  Good  Akathisia:  Negative  Handed:  Right  AIMS (if indicated):     Assets:  Desire for Improvement Housing Resilience  ADL's:  Intact  Cognition:  WNL  Sleep:       Treatment Plan Summary: Daily contact with patient to assess and evaluate symptoms and progress in treatment, Medication management and Plan : Patient is seen and examined.  Patient is a 53 year old male with the above-stated past psychiatric history who presented for evaluation  to the behavioral health hospital is found to be significantly depressed and suicidal.  He will be admitted to the hospital.  He will be integrated into the milieu.  He will be encouraged to attend groups.  Given his heart failure I believe the safest and most effective medication for him would be Zoloft.  We will start at 25 mg p.o. daily and titrate during the course the hospitalization.  He also stated that he dreams significantly, and has a hard time sleeping.  He had previously received a short-term prescription of Xanax from his cardiologist, but that made him feel significantly foggy.  We will initially try trazodone but at low-dose 25 mg p.o. nightly to see if that assists with his sleep.  He will also have available hydroxyzine 25 mg p.o. every 6 hours as needed anxiety.  He did endorse a moderate degree of increase in his alcohol intake on the weekends, but nothing basically during the week.  No drugs as well.  He does live alone, and he does have a significant history of a stepson committing suicide approximately a year ago.  He is at significant risk for self-harm.  His lisinopril and hydrochlorothiazide will be continued as well as his pravastatin.  We will obtain daily weights to make sure that his heart failure is controlled, and we will check a brain natruretic peptide to make sure it is.  He will also have all of her standard labs including a TSH.  He does have a history of sleep apnea, but has not been fitted for a mask yet.  We will monitor this during the course the hospitalization.  Observation Level/Precautions:  15 minute checks  Laboratory:  Chemistry Profile  Psychotherapy:    Medications:    Consultations:    Discharge Concerns:    Estimated LOS:  Other:     Physician Treatment Plan for Primary Diagnosis: <principal problem not specified> Long Term Goal(s): Improvement in symptoms so as ready for discharge  Short Term Goals: Ability to identify changes in lifestyle to reduce  recurrence of condition will improve, Ability to verbalize feelings will improve, Ability to disclose and discuss suicidal ideas, Ability to demonstrate self-control will improve, Ability to identify and develop effective coping behaviors will improve and Ability to maintain clinical measurements within normal limits will improve  Physician Treatment Plan for Secondary Diagnosis: Active Problems:   MDD (major depressive disorder)  Long Term Goal(s): Improvement in symptoms so as ready for  discharge  Short Term Goals: Ability to identify changes in lifestyle to reduce recurrence of condition will improve, Ability to verbalize feelings will improve, Ability to disclose and discuss suicidal ideas, Ability to demonstrate self-control will improve, Ability to identify and develop effective coping behaviors will improve and Ability to maintain clinical measurements within normal limits will improve  I certify that inpatient services furnished can reasonably be expected to improve the patient's condition.    Sharma Covert, MD 9/10/20203:16 PM

## 2019-06-08 NOTE — BH Assessment (Signed)
Assessment Note  Anthony Golden is a 53 y.o. male brought to [facility] by [employer] for a psych evaluation after a safety check was called by his employer when pt didn't show up for work.  Pt states, "I feel like I'm spiraling out of control.  I have suicidal thoughts and yesterday, I thought about taking a gun to my head.  I been going through a divorce for the past 2 years after 14 years of marriage."  Pt reports drinking every weekend since the beginning of the divorce.  Pt states, "I drink 3-4 12oz of beers on the weekends", last used 06/05/2019.  Pt denies any other substance use.  Pt denies HI/A/V-hallucinations.   Pt reports living alone for the past 2 yrs.  Pt reports working full-time.  Pt denies having a history of inpatient/outpatient MH/SA treatment.  Pt denies having a history of physical, sexual, and verbal abuse.   Patient was wearing casual clothes and appeared appropriately groomed.  Pt was alert throughout the assessment.  Patient made fair eye contact and had normal psychomotor activity.  Patient spoke in a normal voice without pressured speech.  Pt expressed feeling sad.  Pt's affect appeared dysphoric and congruent with stated mood. Pt's thought process was coherent and logical.  Pt presented with partial insight and judgement.  Pt did not appear to be responding to internal stimuli.  Pt was not able to reliably contract for safety.  Disposition: [facility] discussed case with [place] Provider, Mordecai Maes, NP who recommends inpatient treatment.  Diagnosis:  F32.2 Major Depressive Disorder, Severe  Past Medical History:  Past Medical History:  Diagnosis Date  . Hypertension   . Sleep apnea    Does not use CPAP    Past Surgical History:  Procedure Laterality Date  . TMJ ARTHROSCOPY      Family History:  Family History  Problem Relation Age of Onset  . Cancer Mother        Lung cancer and colon cancer  . COPD Mother   . Heart disease Father        Died suddenly age 7.      Social  History:  reports that he has never smoked. He has never used smokeless tobacco. He reports current alcohol use. He reports that he does not use drugs.  Additional Social History:  Alcohol / Drug Use Pain Medications: See MARs Prescriptions: See MARs Over the Counter: See MARs History of alcohol / drug use?: Yes Substance #1 Name of Substance 1: Alcohol 1 - Age of First Use: unknown 1 - Amount (size/oz): 3-4 12 oz beers 1 - Frequency: Weekends 1 - Duration: ongoing 1 - Last Use / Amount: 06/05/2019  CIWA:   COWS:    Allergies:  Allergies  Allergen Reactions  . Bee Venom Hives    Home Medications:  No medications prior to admission.    OB/GYN Status:  No LMP for male patient.  General Assessment Data Location of Assessment: [facility] Assessment Services TTS Assessment: In system Is this a Tele or Face-to-Face Assessment?: Face-to-Face Is this an Initial Assessment or a Re-assessment for this encounter?: Initial Assessment Patient Accompanied by:: N/A Language Other than English: No Living Arrangements: Other (Comment) What gender do you identify as?: Male Marital status: Divorced Living Arrangements: Other (Comment) Can pt return to current living arrangement?: Yes Admission Status: Other (Comment) Is patient capable of signing voluntary admission?: Yes Referral Source: Other  Medical Screening Exam ([place]) Medical Exam completed: Yes  Crisis  Care Plan Living Arrangements: Other (Comment) Legal Guardian: Other:(Self) Name of Psychiatrist: No Name of Therapist: No  Education Status Is patient currently in school?: No Is the patient employed, unemployed or receiving disability?: Employed  Risk to self with the past 6 months Suicidal Ideation: Yes-Currently Present Has patient been a risk to self within the past 6 months prior to admission? : Yes Suicidal Intent: Yes-Currently Present Has patient had any suicidal intent within the past 6 months prior to  admission? : Yes Is patient at risk for suicide?: Yes Suicidal Plan?: Yes-Currently Present Has patient had any suicidal plan within the past 6 months prior to admission? : Yes Specify Current Suicidal Plan: Shoot himself in the head with a gun Access to Means: Yes Specify Access to Suicidal Means: gun What has been your use of drugs/alcohol within the last 12 months?: Alcohol Previous Attempts/Gestures: No Triggers for Past Attempts: Other (Comment)(Going through a divorce) Intentional Self Injurious Behavior: None Family Suicide History: Unknown Recent stressful life event(s): Divorce Persecutory voices/beliefs?: No Depression: Yes Depression Symptoms: Despondent, Tearfulness, Isolating, Guilt, Loss of interest in usual pleasures, Feeling worthless/self pity Substance abuse history and/or treatment for substance abuse?: No Suicide prevention information given to non-admitted patients: Not applicable  Risk to Others within the past 6 months Homicidal Ideation: No Does patient have any lifetime risk of violence toward others beyond the six months prior to admission? : No Thoughts of Harm to Others: No Current Homicidal Intent: No Current Homicidal Plan: No Access to Homicidal Means: No History of harm to others?: No Assessment of Violence: None Noted Does patient have access to weapons?: Yes (Comment) Criminal Charges Pending?: No Does patient have a court date: No Is patient on probation?: No  Psychosis Hallucinations: None noted Delusions: None noted  Mental Status Report Appearance/Hygiene: Unremarkable Eye Contact: Fair Motor Activity: Freedom of movement Speech: Logical/coherent Level of Consciousness: Alert, Quiet/awake Mood: Depressed, Helpless, Sad Affect: Appropriate to circumstance, Depressed Anxiety Level: None Thought Processes: Coherent, Relevant Judgement: Partial Orientation: Person, Place, Time, Appropriate for developmental age Obsessive Compulsive  Thoughts/Behaviors: None  Cognitive Functioning Concentration: Normal Memory: Recent Intact, Remote Intact Is patient IDD: No Insight: Poor Impulse Control: Poor Appetite: Fair Have you had any weight changes? : No Change Sleep: Decreased Total Hours of Sleep: 4 Vegetative Symptoms: None  ADLScreening [facility] Assessment Services) Patient's cognitive ability adequate to safely complete daily activities?: Yes Patient able to express need for assistance with ADLs?: Yes Independently performs ADLs?: Yes (appropriate for developmental age)  Prior Inpatient Therapy Prior Inpatient Therapy: No  Prior Outpatient Therapy Prior Outpatient Therapy: No Does patient have an ACCT team?: No Does patient have Intensive In-House Services?  : No Does patient have [facility] services? : No Does patient have P4CC services?: No  ADL Screening (condition at time of admission) Patient's cognitive ability adequate to safely complete daily activities?: Yes Is the patient deaf or have difficulty hearing?: No Does the patient have difficulty seeing, even when wearing glasses/contacts?: No Does the patient have difficulty concentrating, remembering, or making decisions?: No Patient able to express need for assistance with ADLs?: Yes Does the patient have difficulty dressing or bathing?: No Independently performs ADLs?: Yes (appropriate for developmental age) Does the patient have difficulty walking or climbing stairs?: No Weakness of Legs: None Weakness of Arms/Hands: None  Home Assistive Devices/Equipment Home Assistive Devices/Equipment: None    Abuse/Neglect Assessment (Assessment to be complete while patient is alone) Abuse/Neglect Assessment Can Be Completed: Yes Physical Abuse: Denies Verbal  Abuse: Denies Sexual Abuse: Denies Exploitation of patient/patient's resources: Denies Self-Neglect: Denies     Regulatory affairs officer (For Healthcare) Does Patient Have a Medical Advance Directive?:  No Would patient like information on creating a medical advance directive?: No - Patient declined Nutrition Screen- [place] Adult/WL/AP Patient's home diet: NPO        Disposition: [facility] discussed case with [place] Provider, Mordecai Maes, NP who recommends inpatient treatment.  Disposition Initial Assessment Completed for this Encounter: Yes Disposition of Patient: Admit Type of inpatient treatment program: Adult Patient refused recommended treatment: No Mode of transportation if patient is discharged/movement?: N/A  On Site Evaluation by: Berl Bonfanti L. Aleighna Wojtas, MS, [facility], [place] Reviewed with Physician:  Mordecai Maes, NP  Sylvester Harder, MS, [facility], [place] 06/08/2019 11:05 AM

## 2019-06-08 NOTE — Progress Notes (Signed)
Psychoeducational Group Note  Date:  06/08/2019 Time:  2122  Group Topic/Focus:  Wrap-Up Group:   The focus of this group is to help patients review their daily goal of treatment and discuss progress on daily workbooks.  Participation Level: Did Not Attend  Participation Quality:  Not Applicable  Affect:  Not Applicable  Cognitive:  Not Applicable  Insight:  Not Applicable  Engagement in Group: Not Applicable  Additional Comments:  The patient did not attend group since he was admitted to the hallway after group concluded.   Lisandro Meggett S 06/08/2019, 9:22 PM

## 2019-06-08 NOTE — H&P (Signed)
Behavioral Health Medical Screening Exam  Anthony Golden is an 53 y.o. male.who presents as a walk-in, voluntarily, accompanied alone although transported by police. Patient reports while at home, the police came and brought him to the hospital. He reports the police was called by his place of employment as he called out and that was not normal for him. He admits that he told a co-worker that he was having thoughts of wanting to harm himself so I am assuming that his job sent out the police to do a safety/welfare check. He endorses intermittent suicidal thoughts. Reports yesterday, he had a plan to shoot himself in the head. Reports he does on a gun which is still in the home. Reports he and his wife who has been together for 25 years are now in the process of divorce (last 2 years). Reports a history of depression although reports his depression has worsened since the divorce process. He denies any homicidal ideations, psychosis, prior suicide attempts, or prior history of self-harm.  He reports he has been speaking to his PCP in regard to his depression although he he has no other inpatient or outpatient psychiatric treatment. He reports living alone. He reports drinking beer, occasionally, mostly on the weekend. Reports drinking 3-4 12 oz cans during those days. He denies other substance abuse or use. Denies history of physical, sexual or emotional abuse. We discussed concerns for safety and the best treatment option being inpatient psychiatric hospitalization. Although initially hesitant, he voluntarily agreed to be admitted to the unit.   Total Time spent with patient: 15 minutes  Psychiatric Specialty Exam: Physical Exam  Vitals reviewed. Constitutional: He is oriented to person, place, and time.  Neurological: He is alert and oriented to person, place, and time.    Review of Systems  Psychiatric/Behavioral: Positive for depression and suicidal ideas. Negative for hallucinations, memory loss and  substance abuse. The patient is nervous/anxious. The patient does not have insomnia.   All other systems reviewed and are negative.   There were no vitals taken for this visit.There is no height or weight on file to calculate BMI.  General Appearance: Fairly Groomed  Eye Contact:  Good  Speech:  Clear and Coherent and Normal Rate  Volume:  Decreased  Mood:  Anxious and Depressed  Affect:  Depressed  Thought Process:  Coherent, Linear and Descriptions of Associations: Intact  Orientation:  Full (Time, Place, and Person)  Thought Content:  WDL  Suicidal Thoughts:  Yes.  with intent/plan  Homicidal Thoughts:  No  Memory:  Immediate;   Fair Recent;   Fair  Judgement:  Fair  Insight:  Fair  Psychomotor Activity:  Normal  Concentration: Concentration: Good and Attention Span: Good  Recall:  Good  Fund of Knowledge:Good  Language: Good  Akathisia:  Negative  Handed:  Right  AIMS (if indicated):     Assets:  Communication Skills Desire for Improvement Resilience Social Support  Sleep:       Musculoskeletal: Strength & Muscle Tone: within normal limits Gait & Station: normal Patient leans: N/A  There were no vitals taken for this visit.  Recommendations:  Based on my evaluation the patient does not appear to have an emergency medical condition.   There is evidence of imminent risk to self  at present.   Patient does meet criteria for psychiatric inpatient admission. He will be assigned a bed here at [facility] following COVID testing.     Mordecai Maes, NP 06/08/2019, 10:37 AM

## 2019-06-08 NOTE — Progress Notes (Signed)
Pt denies SI/HI, hallucinations, and pain.  He was oriented to unit.  Belongings are in locker 40.  Pt verbally contracts for safety.  He reports he will inform staff of needs and concerns.  Pt is safe on the unit.  Will continue to monitor and assess.

## 2019-06-09 LAB — LDL CHOLESTEROL, DIRECT: Direct LDL: 30 mg/dL (ref 0–99)

## 2019-06-09 MED ORDER — PRAVASTATIN SODIUM 80 MG PO TABS
80.0000 mg | ORAL_TABLET | Freq: Every day | ORAL | Status: DC
  Administered 2019-06-09 – 2019-06-10 (×2): 80 mg via ORAL
  Filled 2019-06-09 (×4): qty 1

## 2019-06-09 MED ORDER — FENOFIBRATE 160 MG PO TABS
160.0000 mg | ORAL_TABLET | Freq: Every day | ORAL | Status: DC
  Administered 2019-06-09 – 2019-06-11 (×3): 160 mg via ORAL
  Filled 2019-06-09 (×5): qty 1

## 2019-06-09 NOTE — BHH Suicide Risk Assessment (Signed)
[place] INPATIENT:  Family/Significant Other Suicide Prevention Education  Suicide Prevention Education:  Contact Attempts: Gralyn Magiera, mother [phone] has been identified by the patient as the family member/significant other with whom the patient will be residing, and identified as the person(s) who will aid the patient in the event of a mental health crisis.  With written consent from the patient, two attempts were made to provide suicide prevention education, prior to and/or following the patient's discharge.  We were unsuccessful in providing suicide prevention education.  A suicide education pamphlet was given to the patient to share with family/significant other.  Date and time of first attempt: 06/09/19 135pm CSW left confidential VM Date and time of second attempt: 2nd attempt needed  Anthony Golden 06/09/2019, 1:38 PM

## 2019-06-09 NOTE — BHH Counselor (Signed)
Adult Comprehensive Assessment  Patient ID: Anthony Golden, male   DOB: 1966-01-10, 53 y.o.   MRN: [id number]  Information Source: Information source: Patient  Current Stressors:  Patient states their primary concerns and needs for treatment are:: Depression, SI Patient states their goals for this hospitilization and ongoing recovery are:: "Positive thinking" Educational / Learning stressors: None Employment / Job issues: None Family Relationships: Positive relationship with family members Museum/gallery curator / Lack of resources (include bankruptcy): None Housing / Lack of housing: Stable housing Physical health (include injuries & life threatening diseases): None reported Social relationships: "Rare, I  work Counsellor Substance abuse: Pt denies Bereavement / Loss: None reported  Living/Environment/Situation:  Living Arrangements: Alone What is atmosphere in current home: Comfortable  Family History:  Marital status: Divorced Additional relationship information: Pt reports he was married for 12 yrs What is your sexual orientation?: Heterosexual Has your sexual activity been affected by drugs, alcohol, medication, or emotional stress?: No Does patient have children?: No  Childhood History:  By whom was/is the patient raised?: Mother Description of patient's relationship with caregiver when they were a child: "fine" Patient's description of current relationship with people who raised him/her: "Great" Does patient have siblings?: Yes Number of Siblings: 5 Description of patient's current relationship with siblings: 4 bros, 1 sis, reports good relationship Did patient suffer any verbal/emotional/physical/sexual abuse as a child?: No Did patient suffer from severe childhood neglect?: No Has patient ever been sexually abused/assaulted/raped as an adolescent or adult?: No Was the patient ever a victim of a crime or a disaster?: No Witnessed domestic violence?: No Has patient been effected by  domestic violence as an adult?: No  Education:  Highest grade of school patient has completed: some college Currently a Ship broker?: No Learning disability?: No  Employment/Work Situation:   Employment situation: Employed Where is patient currently employed?: [place] [employer] How long has patient been employed?: 17 yrs Patient's job has been impacted by current illness: No Did You Receive Any Psychiatric Treatment/Services While in the [employer]?: No Are There Guns or Other Weapons in [place]?: Yes Types of Guns/Weapons: .79 Are These Weapons Safely Secured?: Yes(Pt reports gun is secured in gun safe)  Financial Resources:   Financial resources: Income from employment Does patient have a representative payee or guardian?: No  Alcohol/Substance Abuse:   What has been your use of drugs/alcohol within the last 12 months?: Pt denies If attempted suicide, did drugs/alcohol play a role in this?: No Alcohol/Substance Abuse Treatment Hx: Denies past history Has alcohol/substance abuse ever caused legal problems?: No  Social Support System:   Pensions consultant Support System: Good Describe Community Support System: Family members, coworkers Type of faith/religion: Darrick Meigs How does patient's faith help to cope with current illness?: "I love the message it gives"  Leisure/Recreation:   Leisure and Hobbies: Insurance underwriter, music  Strengths/Needs:   What is the patient's perception of their strengths?: Work ethic Patient states they can use these personal strengths during their treatment to contribute to their recovery: Stay busy Patient states these barriers may affect/interfere with their treatment: None Patient states these barriers may affect their return to the community: None  Discharge Plan:   Currently receiving community mental health services: No Patient states concerns and preferences for aftercare planning are: Pt wants referral for outpatient treatment Patient states they will  know when they are safe and ready for discharge when: "Thoughts and how I feel" Does patient have access to transportation?: Yes Does patient have financial barriers related  to discharge medications?: No Will patient be returning to same living situation after discharge?: Yes  Summary/Recommendations:   Summary and Recommendations (to be completed by the evaluator): Pt is a 52 yr old male with a diagnosis of major depressive disorder. Pt says he was hospitalized due to experiencing depression and suicidal ideation. Pt denies any drug and alcohol use. Pt reports having access to a firearm in the home but states it is secured in a gun safe. Pt states he does not have a mental health provider but would like a referral for an outpatient treatment. While here, patient will benefit from crisis stabilization, medication evaluation, group therapy and psychoeducation. In addition, it is recommended that patient remain compliant with the established discharge plan and continue treatment.  Jeyson Deshotel Lynelle Smoke. 06/09/2019

## 2019-06-09 NOTE — Tx Team (Signed)
Interdisciplinary Treatment and Diagnostic Plan Update  06/09/2019 Time of Session: [place] MRN: [id number]  Principal Diagnosis: <principal problem not specified>  Secondary Diagnoses: Active Problems:   MDD (major depressive disorder)   Current Medications:  Current Facility-Administered Medications  Medication Dose Route Frequency Provider Last Rate Last Dose  . acetaminophen (TYLENOL) tablet 650 mg  650 mg Oral Q6H PRN Mordecai Maes, NP      . alum & mag hydroxide-simeth (MAALOX/MYLANTA) 200-200-20 MG/5ML suspension 30 mL  30 mL Oral Q4H PRN Mordecai Maes, NP      . hydrochlorothiazide (MICROZIDE) capsule 12.5 mg  12.5 mg Oral Daily Sharma Covert, MD   12.5 mg at 06/09/19 0819  . hydrOXYzine (ATARAX/VISTARIL) tablet 25 mg  25 mg Oral Q6H PRN Sharma Covert, MD   25 mg at 06/08/19 2253  . lisinopril (ZESTRIL) tablet 20 mg  20 mg Oral Daily Sharma Covert, MD   20 mg at 06/09/19 4650  . pantoprazole (PROTONIX) EC tablet 40 mg  40 mg Oral Daily Sharma Covert, MD   40 mg at 06/09/19 3546  . pravastatin (PRAVACHOL) tablet 40 mg  40 mg Oral q1800 Sharma Covert, MD   40 mg at 06/08/19 1734  . sertraline (ZOLOFT) tablet 25 mg  25 mg Oral Daily Sharma Covert, MD   25 mg at 06/09/19 0820  . traZODone (DESYREL) tablet 25 mg  25 mg Oral QHS PRN Sharma Covert, MD   25 mg at 06/08/19 2135   PTA Medications: Medications Prior to Admission  Medication Sig Dispense Refill Last Dose  . ALPRAZolam (XANAX) 0.5 MG tablet Take 0.5 mg by mouth 2 (two) times daily as needed for anxiety.   Past Month at Unknown time  . fluticasone (FLONASE) 50 MCG/ACT nasal spray Place 1 spray into both nostrils daily as needed for allergies or rhinitis.   Past Month at Unknown time  . lisinopril-hydrochlorothiazide (PRINZIDE,ZESTORETIC) 20-12.5 MG tablet Take 1 tablet by mouth daily.   1 06/08/2019 at Unknown time  . pravastatin (PRAVACHOL) 40 MG tablet Take 1 tablet (40 mg  total) by mouth every evening. 90 tablet 3 Past Week at Unknown time    Patient Stressors: Loss of dog Other: recently divorced  Patient Strengths: Ability for insight Communication skills Motivation for treatment/growth  Treatment Modalities: Medication Management, Group therapy, Case management,  1 to 1 session with clinician, Psychoeducation, Recreational therapy.   Physician Treatment Plan for Primary Diagnosis: <principal problem not specified> Long Term Goal(s): Improvement in symptoms so as ready for discharge Improvement in symptoms so as ready for discharge   Short Term Goals: Ability to identify changes in lifestyle to reduce recurrence of condition will improve Ability to verbalize feelings will improve Ability to disclose and discuss suicidal ideas Ability to demonstrate self-control will improve Ability to identify and develop effective coping behaviors will improve Ability to maintain clinical measurements within normal limits will improve Ability to identify changes in lifestyle to reduce recurrence of condition will improve Ability to verbalize feelings will improve Ability to disclose and discuss suicidal ideas Ability to demonstrate self-control will improve Ability to identify and develop effective coping behaviors will improve Ability to maintain clinical measurements within normal limits will improve  Medication Management: Evaluate patient's response, side effects, and tolerance of medication regimen.  Therapeutic Interventions: 1 to 1 sessions, Unit Group sessions and Medication administration.  Evaluation of Outcomes: Not Met  Physician Treatment Plan for Secondary Diagnosis: Active Problems:  MDD (major depressive disorder)  Long Term Goal(s): Improvement in symptoms so as ready for discharge Improvement in symptoms so as ready for discharge   Short Term Goals: Ability to identify changes in lifestyle to reduce recurrence of condition will  improve Ability to verbalize feelings will improve Ability to disclose and discuss suicidal ideas Ability to demonstrate self-control will improve Ability to identify and develop effective coping behaviors will improve Ability to maintain clinical measurements within normal limits will improve Ability to identify changes in lifestyle to reduce recurrence of condition will improve Ability to verbalize feelings will improve Ability to disclose and discuss suicidal ideas Ability to demonstrate self-control will improve Ability to identify and develop effective coping behaviors will improve Ability to maintain clinical measurements within normal limits will improve     Medication Management: Evaluate patient's response, side effects, and tolerance of medication regimen.  Therapeutic Interventions: 1 to 1 sessions, Unit Group sessions and Medication administration.  Evaluation of Outcomes: Not Met   RN Treatment Plan for Primary Diagnosis: <principal problem not specified> Long Term Goal(s): Knowledge of disease and therapeutic regimen to maintain health will improve  Short Term Goals: Ability to participate in decision making will improve, Ability to disclose and discuss suicidal ideas, Ability to identify and develop effective coping behaviors will improve and Compliance with prescribed medications will improve  Medication Management: RN will administer medications as ordered by provider, will assess and evaluate patient's response and provide education to patient for prescribed medication. RN will report any adverse and/or side effects to prescribing provider.  Therapeutic Interventions: 1 on 1 counseling sessions, Psychoeducation, Medication administration, Evaluate responses to treatment, Monitor vital signs and CBGs as ordered, Perform/monitor CIWA, COWS, AIMS and Fall Risk screenings as ordered, Perform wound care treatments as ordered.  Evaluation of Outcomes: Not Met   LCSW  Treatment Plan for Primary Diagnosis: <principal problem not specified> Long Term Goal(s): Safe transition to appropriate next level of care at discharge, Engage patient in therapeutic group addressing interpersonal concerns.  Short Term Goals: Engage patient in aftercare planning with referrals and resources  Therapeutic Interventions: Assess for all discharge needs, 1 to 1 time with Social worker, Explore available resources and support systems, Assess for adequacy in community support network, Educate family and significant other(s) on suicide prevention, Complete Psychosocial Assessment, Interpersonal group therapy.  Evaluation of Outcomes: Not Met   Progress in Treatment: Attending groups: No. Participating in groups: No. Taking medication as prescribed: Yes. Toleration medication: Yes. Family/Significant other contact made: No, will contact:  mother Patient understands diagnosis: Yes. Discussing patient identified problems/goals with staff: Yes. Medical problems stabilized or resolved: No. Denies suicidal/homicidal ideation: No. Issues/concerns per patient self-inventory: No. Other: NA  New problem(s) identified: No, Describe:  none reported  New Short Term/Long Term Goal(s):  Patient Goals:  "Positive thinking"  Discharge Plan or Barriers:   Reason for Continuation of Hospitalization: Medication stabilization Suicidal ideation  Estimated Length of Stay:  Attendees: Patient: 06/09/2019 11:35 AM  Physician: Neita Garnet 06/09/2019 11:35 AM  Nursing: Benjamine Mola A 06/09/2019 11:35 AM  RN Care Manager: 06/09/2019 11:35 AM  Social Worker: Sanjuana Kava 06/09/2019 11:35 AM  Recreational Therapist:  06/09/2019 11:35 AM  Other:  06/09/2019 11:35 AM  Other:  06/09/2019 11:35 AM  Other: 06/09/2019 11:35 AM    Scribe for Treatment Team: Yvette Rack, LCSW 06/09/2019 11:35 AM

## 2019-06-09 NOTE — Consult Note (Signed)
Received a call from Dr. Parke Poisson about this patient who is admitted with suicidal ideation he has a history of HTN OSA Screening labs showed elevated AST/ALT in addition to very high triglycerides of 700  Patient is hemodynamically stable per Dr. Parke Poisson, the reason for the call was these elevated labs and further outpatient work-up  I have recommended increase in Pravachol from 40 to 80 mg I have added a fibrillate in addition and the patient should have screening LFTs and a triglyceride level in about 3 months  We have a lipidologist in town Dr. Debara Pickett who can see the patient in consult if there is a need  I am available in the hospital if you have any further needs please contact me directly  Verneita Griffes, MD [facility] 1:37 PM

## 2019-06-09 NOTE — Progress Notes (Signed)
Recreation Therapy Notes  Date:  9.11.20 Time: 0930 Location: [address] Dayroom  Group Topic: Stress Management  Goal Area(s) Addresses:  Patient will identify positive stress management techniques. Patient will identify benefits of using stress management post d/c.  Intervention: Stress Management  Activity : Meditation    Education:  Stress Management, Discharge Planning.   Education Outcome: Acknowledges Education  Clinical Observations/Feedback:  Pt did not attend group.    Victorino Sparrow, LRT/CTRS         Victorino Sparrow A 06/09/2019 12:25 PM

## 2019-06-09 NOTE — Progress Notes (Signed)
D: Pt was in the dayroom upon initial approach.  Pt presents with depressed, anxious affect and mood.  He brightens with interaction.  Describes his day as "good" and reports goal is to "get a good nights sleep."  Pt denies SI/HI, denies hallucinations, denies pain.  Pt has been visible in milieu interacting with peers and staff appropriately.     A: Introduced self to pt.  Met with pt 1:1.  Actively listened to pt and offered support and encouragement.  PRN medication administered for anxiety and sleep.  15 minute safety checks in place.  R: Pt is safe on the unit.  Pt is compliant with medications.  Pt verbally contracts for safety.  Will continue to monitor and assess.

## 2019-06-09 NOTE — Progress Notes (Signed)
[facility] MD Progress Note  06/09/2019 1:19 PM Anthony Golden  MRN:  [id number] Subjective:  Patient reports he is feeling " OK" today. Currently denies suicidal ideations. Denies medication side effects. Objective: I have discussed case with treatment team and have met with patient. 53 year old employed male, reports coworkers had called authorities for safety check at his home and acknowledges he had told a coworker he had suicidal thoughts.  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 Currently reports she is feeling better.  Today denies suicidal ideations and contracts for safety on unit.  He presents future oriented and is focused on discharge planning/returning to work soon.  His affect is appropriate/reactive although vaguely anxious. At this time denies medication side effects-started on Zoloft which he is tolerating well thus far.  Labs reviewed - AST/ ALT elevated ( 188/146) . Lipid profile remarkable for hypertriglyceridemia. Of note, patient denies alcohol abuse or dependence and states he drinks only on occasion. He also denies known history of viral hepatitis .  Admission BAL was negative. He is not presenting with tremors or diaphoresis at this time and does not appear restless or tremulous at this time.  CBC unremarkable, serum glucose 94, TSH 3.599 No disruptive or agitated behaviors on unit .  Principal Problem: MDD Diagnosis: Active Problems:   MDD (major depressive disorder)  Total Time spent with patient: 20 minutes  Past Psychiatric History:   Past Medical History:  Past Medical History:  Diagnosis Date  . Hypertension   . Sleep apnea    Does not use CPAP    Past Surgical History:  Procedure Laterality Date  . TMJ ARTHROSCOPY     Family History:  Family History  Problem Relation Age of Onset  . Cancer Mother        Lung cancer and colon cancer  . COPD Mother   . Heart  disease Father        Died suddenly age 80.     Family Psychiatric  History:  Social History:  Social History   Substance and Sexual Activity  Alcohol Use Yes   Comment: Pt report occasianly drinking 1/2 beers per month     Social History   Substance and Sexual Activity  Drug Use No    Social History   Socioeconomic History  . Marital status: Single    Spouse name: Not on file  . Number of children: Not on file  . Years of education: Not on file  . Highest education level: Not on file  Occupational History  . Not on file  Social Needs  . Financial resource strain: Not on file  . Food insecurity    Worry: Not on file    Inability: Not on file  . Transportation needs    Medical: Not on file    Non-medical: Not on file  Tobacco Use  . Smoking status: Never Smoker  . Smokeless tobacco: Never Used  Substance and Sexual Activity  . Alcohol use: Yes    Comment: Pt report occasianly drinking 1/2 beers per month  . Drug use: No  . Sexual activity: Not Currently  Lifestyle  . Physical activity    Days per week: Not on file    Minutes per session: Not on file  . Stress: Not on file  Relationships  . Social Herbalist on phone: Not on file    Gets together: Not on file  Attends religious service: Not on file    Active member of club or organization: Not on file    Attends meetings of clubs or organizations: Not on file    Relationship status: Not on file  Other Topics Concern  . Not on file  Social History Narrative  . Not on file   Additional Social History:    Pain Medications: See MARs Prescriptions: See MARs Over the Counter: See MARs History of alcohol / drug use?: Yes Name of Substance 1: Alcohol 1 - Age of First Use: unknown 1 - Amount (size/oz): 3-4 12 oz beers 1 - Frequency: Weekends 1 - Duration: ongoing 1 - Last Use / Amount: 06/05/2019  Sleep: Good  Appetite:  Good  Current Medications: Current Facility-Administered Medications   Medication Dose Route Frequency Provider Last Rate Last Dose  . acetaminophen (TYLENOL) tablet 650 mg  650 mg Oral Q6H PRN Mordecai Maes, NP      . alum & mag hydroxide-simeth (MAALOX/MYLANTA) 200-200-20 MG/5ML suspension 30 mL  30 mL Oral Q4H PRN Mordecai Maes, NP      . hydrochlorothiazide (MICROZIDE) capsule 12.5 mg  12.5 mg Oral Daily Sharma Covert, MD   12.5 mg at 06/09/19 0819  . hydrOXYzine (ATARAX/VISTARIL) tablet 25 mg  25 mg Oral Q6H PRN Sharma Covert, MD   25 mg at 06/08/19 2253  . lisinopril (ZESTRIL) tablet 20 mg  20 mg Oral Daily Sharma Covert, MD   20 mg at 06/09/19 6789  . pantoprazole (PROTONIX) EC tablet 40 mg  40 mg Oral Daily Sharma Covert, MD   40 mg at 06/09/19 3810  . pravastatin (PRAVACHOL) tablet 40 mg  40 mg Oral q1800 Sharma Covert, MD   40 mg at 06/08/19 1734  . sertraline (ZOLOFT) tablet 25 mg  25 mg Oral Daily Sharma Covert, MD   25 mg at 06/09/19 0820  . traZODone (DESYREL) tablet 25 mg  25 mg Oral QHS PRN Sharma Covert, MD   25 mg at 06/08/19 2135    Lab Results:  Results for orders placed or performed during the hospital encounter of 06/08/19 (from the past 48 hour(s))  SARS CORONAVIRUS 2 (TAT 6-24 HRS) Nasopharyngeal Nasopharyngeal Swab     Status: None   Collection Time: 06/08/19 11:05 AM   Specimen: Nasopharyngeal Swab  Result Value Ref Range   SARS Coronavirus 2 NEGATIVE NEGATIVE    Comment: (NOTE) SARS-CoV-2 target nucleic acids are NOT DETECTED. The SARS-CoV-2 RNA is generally detectable in upper and lower respiratory specimens during the acute phase of infection. Negative results do not preclude SARS-CoV-2 infection, do not rule out co-infections with other pathogens, and should not be used as the sole basis for treatment or other patient management decisions. Negative results must be combined with clinical observations, patient history, and epidemiological information. The expected result is  Negative. Fact Sheet for Patients: SugarRoll.be Fact Sheet for Healthcare Providers: https://www.woods-mathews.com/ This test is not yet approved or cleared by the [place] FDA and  has been authorized for detection and/or diagnosis of SARS-CoV-2 by FDA under an Emergency Use Authorization (EUA). This EUA will remain  in effect (meaning this test can be used) for the duration of the COVID-19 declaration under Section 56 4(b)(1) of the Act, 21 U.S.C. section 360bbb-3(b)(1), unless the authorization is terminated or revoked sooner. Performed at [facility], [place] [address] [place], [place] [address]   Comprehensive metabolic panel     Status: Abnormal  Collection Time: 06/08/19  6:37 PM  Result Value Ref Range   Sodium 136 135 - 145 mmol/L   Potassium 3.8 3.5 - 5.1 mmol/L   Chloride 99 98 - 111 mmol/L   CO2 23 22 - 32 mmol/L   Glucose, Bld 94 70 - 99 mg/dL   BUN 11 6 - 20 mg/dL   Creatinine, Ser 0.78 0.61 - 1.24 mg/dL   Calcium 9.0 8.9 - 10.3 mg/dL   Total Protein 7.2 6.5 - 8.1 g/dL   Albumin 4.1 3.5 - 5.0 g/dL   AST 188 (H) 15 - 41 U/L   ALT 146 (H) 0 - 44 U/L   Alkaline Phosphatase 55 38 - 126 U/L   Total Bilirubin 1.2 0.3 - 1.2 mg/dL   GFR calc non Af Amer >60 >60 mL/min   GFR calc Af Amer >60 >60 mL/min   Anion gap 14 5 - 15    Comment: Performed at [facility], [place] [address], [place], [place] [address]  Ethanol     Status: None   Collection Time: 06/08/19  6:37 PM  Result Value Ref Range   Alcohol, Ethyl (B) <10 <10 mg/dL    Comment: (NOTE) Lowest detectable limit for serum alcohol is 10 mg/dL. For medical purposes only. Performed at [facility], [place] [address], [place], [place] [address]   Lipid panel     Status: Abnormal   Collection Time: 06/08/19  6:37 PM  Result Value Ref Range   Cholesterol 162 0 - 200 mg/dL   Triglycerides 746 (H) <150 mg/dL   HDL 30 (L) >40  mg/dL   Total CHOL/HDL Ratio 5.4 RATIO   VLDL UNABLE TO CALCULATE IF TRIGLYCERIDE OVER 400 mg/dL 0 - 40 mg/dL   LDL Cholesterol UNABLE TO CALCULATE IF TRIGLYCERIDE OVER 400 mg/dL 0 - 99 mg/dL    Comment:        Total Cholesterol/HDL:CHD Risk Coronary Heart Disease Risk Table                     Men   Women  1/2 Average Risk   3.4   3.3  Average Risk       5.0   4.4  2 X Average Risk   9.6   7.1  3 X Average Risk  23.4   11.0        Use the calculated Patient Ratio above and the CHD Risk Table to determine the patient's CHD Risk.        ATP III CLASSIFICATION (LDL):  <100     mg/dL   Optimal  100-129  mg/dL   Near or Above                    Optimal  130-159  mg/dL   Borderline  160-189  mg/dL   High  >190     mg/dL   Very High Performed at [facility] [address], [place], [place] [address]   TSH     Status: None   Collection Time: 06/08/19  6:37 PM  Result Value Ref Range   TSH 3.599 0.350 - 4.500 uIU/mL    Comment: Performed by a 3rd Generation assay with a functional sensitivity of <=0.01 uIU/mL. Performed at [facility], [place] [address] [place], [place] [address]   Brain natriuretic peptide     Status: None   Collection Time: 06/08/19  6:37 PM  Result Value Ref  Range   B Natriuretic Peptide 31.4 0.0 - 100.0 pg/mL    Comment: Performed at [facility], [place] [address] [place], [place] [address]  CBC     Status: None   Collection Time: 06/08/19  6:37 PM  Result Value Ref Range   WBC 8.1 4.0 - 10.5 K/uL   RBC 4.88 4.22 - 5.81 MIL/uL   Hemoglobin 15.8 13.0 - 17.0 g/dL   HCT 46.2 39.0 - 52.0 %   MCV 94.7 80.0 - 100.0 fL   MCH 32.4 26.0 - 34.0 pg   MCHC 34.2 30.0 - 36.0 g/dL   RDW 12.3 11.5 - 15.5 %   Platelets 269 150 - 400 K/uL   nRBC 0.0 0.0 - 0.2 %    Comment: Performed at [facility], [place] [address], [place], [place] [address]  LDL cholesterol, direct     Status: None    Collection Time: 06/08/19  6:37 PM  Result Value Ref Range   Direct LDL 30.0 0 - 99 mg/dL    Comment: Performed at [facility] [address], [place], [place] [address]    Blood Alcohol level:  Lab Results  Component Value Date   ETH <10 71/02/2693    Metabolic Disorder Labs: No results found for: HGBA1C, MPG No results found for: PROLACTIN Lab Results  Component Value Date   CHOL 162 06/08/2019   TRIG 746 (H) 06/08/2019   HDL 30 (L) 06/08/2019   CHOLHDL 5.4 06/08/2019   VLDL UNABLE TO CALCULATE IF TRIGLYCERIDE OVER 400 mg/dL 06/08/2019   LDLCALC UNABLE TO CALCULATE IF TRIGLYCERIDE OVER 400 mg/dL 06/08/2019    Physical Findings: AIMS: Facial and Oral Movements Muscles of Facial Expression: None, normal Lips and Perioral Area: None, normal Jaw: None, normal Tongue: None, normal,Extremity Movements Upper (arms, wrists, hands, fingers): None, normal Lower (legs, knees, ankles, toes): None, normal, Trunk Movements Neck, shoulders, hips: None, normal, Overall Severity Severity of abnormal movements (highest score from questions above): None, normal Incapacitation due to abnormal movements: None, normal Patient's awareness of abnormal movements (rate only patient's report): No Awareness, Dental Status Current problems with teeth and/or dentures?: No Does patient usually wear dentures?: No  CIWA:  CIWA-Ar Total: 2 COWS:  COWS Total Score: 3  Musculoskeletal: Strength & Muscle Tone: within normal limits- no tremors, no diaphoresis Gait & Station: normal Patient leans: N/A  Psychiatric Specialty Exam: Physical Exam  ROS denies headache , denies chest pain, no shortness of breath at room air, no vomiting, no fever  Blood pressure (!) 120/100, pulse (!) 117, temperature 98.4 F (36.9 C), temperature source Oral, resp. rate 16, height _0  (1.778 m), weight 106.6 kg, SpO2 100 %.Body mass index is 33.72 kg/m.  Repeat vitals 126/93, pulse 107.    General  Appearance: Well Groomed  Eye Contact:  Good  Speech:  Normal Rate  Volume:  Normal  Mood:  reports improving mood  Affect:  Appropriate  Thought Process:  Linear and Descriptions of Associations: Intact  Orientation:  Other:  fully alert and attentive   Thought Content:  no hallucinations, no delusions   Suicidal Thoughts:  No currently denies suicidal or self injurious ideations,denies homicidal or violent ideations. Contracts for safety on unit   Homicidal Thoughts:  No  Memory:  recent and remote grossly intact   Judgement:  Fair/ improving   Insight:  Fair  Psychomotor Activity:  Normal- no restlessness or agitation, no tremors or diaphoresis  Concentration:  Concentration: Good and Attention Span: Good  Recall:  Good  Fund of Knowledge:  Good  Language:  Good  Akathisia:  Negative  Handed:  Right  AIMS (if indicated):     Assets:  Communication Skills Desire for Improvement Resilience  ADL's:  Intact  Cognition:  WNL  Sleep:  Number of Hours: 6.25   Assessment -  81 year old employed male, reports coworkers had called authorities for safety check at his home and acknowledges he had told a coworker he had suicidal thoughts.  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  Today patient reports she is feeling better than on admission and currently describes improving mood and denies suicidal ideation/presents future oriented.  Denies medication side effects.  Of note, presents with tachycardia. No symptoms of possible WDL endorsed . Denies associated symptoms and denies alcohol abuse or dependence/admission BAL was negative. He reports he is prescribed low dose Xanax but takes it infrequently.   LFTs are elevated , which may be related to fatty liver .  Have reviewed case with Hospitalist Consultant , who recommended pravachol dose increase and adding Fenofibrate . Liver U/S not  recommended at this time. Will check Hep B/C , repeat LFTs in AM.   Treatment Plan Summary: Daily contact with patient to assess and evaluate symptoms and progress in treatment, Medication management, Plan inpatient treatment and medications as below Encourage group and milieu participation Continue HCTZ 12.5 mg daily and lisinopril 20 mg daily for hypertension Continue protocol at 80 mg daily and start fenofibrate 160 mg daily for hyperlipidemia/hypertriglyceridemia Increase Zoloft to 50 mg daily for depression Check EKG  Check Hep C Ab  /Hep BSAg  Treatment team working on disposition planning options  Jenne Campus, MD 06/09/2019, 1:19 PM

## 2019-06-09 NOTE — Progress Notes (Signed)
DAR NOTE: Patient presents with calm affect and pleasant mood.  Denies suicidal thoughts, pain, auditory and visual hallucinations.  Described energy level as normal and concentration as good.  Rates depression at 2, hopelessness at 1, and anxiety at 1.  Maintained on routine safety checks.  Medications given as prescribed.  Support and encouragement offered as needed.  Attended group and participated.  States goal for today is "focus on positives."  Patient observed socializing with peers in the dayroom.  Patient is safe on and off the unit.  Offered no complaint.

## 2019-06-09 NOTE — BHH Group Notes (Signed)
[facility] Group Notes:  (Nursing/MHT/Case Management/Adjunct)  Date:  06/09/2019  Time:  10:00 AM  Type of Therapy:  Nurse Education  Participation Level:  Minimal  Participation Quality:  Appropriate and Attentive  Affect:  Appropriate  Cognitive:  Alert and Appropriate  Insight:  Appropriate and Lacking  Engagement in Group:  Engaged and Lacking  Modes of Intervention:  Discussion, Education and Exploration  Summary of Progress/Problems: Pt's discussed their own setbacks in recovery with this admission and possibly past. Pt's discussed triggers that have been present before setbacks in recovery have occurred. Lastly, pt's discussed their goal for the day and how this and present coping skills can relate to progressing past this setback. Pt shared their goal for today and how this was helping their setback in recovery. Pt also stated coping strategies that they and others could use if setbacks occurred again. Pt was vague at times and seemed guarded in their answers. Pt was appropriate and pleasant.  Anthony Golden 06/09/2019, 10:59 AM

## 2019-06-09 NOTE — BHH Group Notes (Signed)
Adult Psychoeducational Group Note  Date:  06/09/2019 Time:  9:23 PM  Group Topic/Focus:  Wrap-Up Group:   The focus of this group is to help patients review their daily goal of treatment and discuss progress on daily workbooks.  Participation Level:  Active  Participation Quality:  Appropriate and Attentive  Affect:  Appropriate  Cognitive:  Alert and Appropriate  Insight: Appropriate and Good  Engagement in Group:  Engaged  Modes of Intervention:  Discussion and Education  Additional Comments:  Pt attended and participated in wrap up group this evening and rated their day a 9/10. Pt completed their goal, which was to take it easy and not stress too much.   Cristi Loron 06/09/2019, 9:23 PM

## 2019-06-09 NOTE — BHH Suicide Risk Assessment (Signed)
[facility] INPATIENT:  Family/Significant Other Suicide Prevention Education  Suicide Prevention Education:  Education Completed; Anthony Golden, mother [phone] has been identified by the patient as the family member/significant other with whom the patient will be residing, and identified as the person(s) who will aid the patient in the event of a mental health crisis (suicidal ideations/suicide attempt).  With written consent from the patient, the family member/significant other has been provided the following suicide prevention education, prior to the and/or following the discharge of the patient.  The suicide prevention education provided includes the following:  Suicide risk factors  Suicide prevention and interventions  National Suicide Hotline telephone number  [facility] assessment telephone number  [facility] Emergency Assistance [place] and/or Residential Mobile Crisis Unit telephone number  Request made of family/significant other to:  Remove weapons (e.g., guns, rifles, knives), all items previously/currently identified as safety concern.    Remove drugs/medications (over-the-counter, prescriptions, illicit drugs), all items previously/currently identified as a safety concern.  The family member/significant other verbalizes understanding of the suicide prevention education information provided.  The family member/significant other agrees to remove the items of safety concern listed above. Anthony Golden reports the pt has been experiencing restlessness, crying spells and feelings of loneliness. She discussed two of his friends passing away recently and him having difficulty coping with the losses. In addition, she reports he is going through a divorce. Anthony Golden states the pt owns firearms and says they are stored in a gun safe but she does not know the combination. She states her and the pt's brother will be going to the home to remove the firearm that is  located near his bed.   Anthony Golden 06/09/2019, 2:54 PM

## 2019-06-10 LAB — HEPATIC FUNCTION PANEL
ALT: 104 U/L — ABNORMAL HIGH (ref 0–44)
AST: 86 U/L — ABNORMAL HIGH (ref 15–41)
Albumin: 4.2 g/dL (ref 3.5–5.0)
Alkaline Phosphatase: 59 U/L (ref 38–126)
Bilirubin, Direct: 0.5 mg/dL — ABNORMAL HIGH (ref 0.0–0.2)
Indirect Bilirubin: 2 mg/dL — ABNORMAL HIGH (ref 0.3–0.9)
Total Bilirubin: 2.5 mg/dL — ABNORMAL HIGH (ref 0.3–1.2)
Total Protein: 7.7 g/dL (ref 6.5–8.1)

## 2019-06-10 LAB — HEMOGLOBIN A1C
Hgb A1c MFr Bld: 5.4 % (ref 4.8–5.6)
Mean Plasma Glucose: 108 mg/dL

## 2019-06-10 MED ORDER — SERTRALINE HCL 50 MG PO TABS
50.0000 mg | ORAL_TABLET | Freq: Every day | ORAL | Status: DC
  Filled 2019-06-10: qty 1

## 2019-06-10 NOTE — Progress Notes (Signed)
[facility] MD Progress Note  06/10/2019 9:19 AM Anthony Golden  MRN:  [id number] Subjective: Patient reports he is feeling better.  He denies suicidal ideations.  He worries about his elderly mother, and is hopeful for discharge soon in order to return to work and continue caring for her. Currently does not endorse medication side effects Objective: I have reviewed chart notes and have met with patient. 53 year old employed male, reports coworkers had called authorities for safety check at his home and acknowledges he had told a coworker he had suicidal thoughts.  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 Currently reports she is feeling better.  Today denies suicidal ideations and contracts for safety on unit.  He presents future oriented and is focused on discharge planning/returning to work soon.  His affect is appropriate/reactive although vaguely anxious.  Patient presents alert, calm, pleasant on approach.  Denies suicidal ideations.  Describes improved mood and currently minimizes depression.  Affect is reactive. Denies medication side effects.  Behavior on unit in good control, no disruptive or agitated behaviors.  Visible in dayroom. Labs reviewed-AST improved to 86, ALT improved to 104. Patient aware of dyslipidemia/hypertriglyceridemia and elevated LFTs.  He has denied any alcohol abuse or known history of viral hepatitis or risk factors for blood-borne infection.  He has agreed to viral hepatitis screening.  We discussed likely benefits associated with improved diet and weight loss to address dyslipidemia and potential fatty liver. 9/11 EKG-NSR, heart rate 97, QTc 507.   Principal Problem: MDD Diagnosis: Active Problems:   MDD (major depressive disorder)  Total Time spent with patient: 20 minutes  Past Psychiatric History:   Past Medical History:  Past Medical History:  Diagnosis Date  .  Hypertension   . Sleep apnea    Does not use CPAP    Past Surgical History:  Procedure Laterality Date  . TMJ ARTHROSCOPY     Family History:  Family History  Problem Relation Age of Onset  . Cancer Mother        Lung cancer and colon cancer  . COPD Mother   . Heart disease Father        Died suddenly age 52.     Family Psychiatric  History:  Social History:  Social History   Substance and Sexual Activity  Alcohol Use Yes   Comment: Pt report occasianly drinking 1/2 beers per month     Social History   Substance and Sexual Activity  Drug Use No    Social History   Socioeconomic History  . Marital status: Single    Spouse name: Not on file  . Number of children: Not on file  . Years of education: Not on file  . Highest education level: Not on file  Occupational History  . Not on file  Social Needs  . Financial resource strain: Not on file  . Food insecurity    Worry: Not on file    Inability: Not on file  . Transportation needs    Medical: Not on file    Non-medical: Not on file  Tobacco Use  . Smoking status: Never Smoker  . Smokeless tobacco: Never Used  Substance and Sexual Activity  . Alcohol use: Yes    Comment: Pt report occasianly drinking 1/2 beers per month  . Drug use: No  . Sexual activity: Not Currently  Lifestyle  . Physical activity    Days per week: Not on file  Minutes per session: Not on file  . Stress: Not on file  Relationships  . Social Herbalist on phone: Not on file    Gets together: Not on file    Attends religious service: Not on file    Active member of club or organization: Not on file    Attends meetings of clubs or organizations: Not on file    Relationship status: Not on file  Other Topics Concern  . Not on file  Social History Narrative  . Not on file   Additional Social History:    Pain Medications: See MARs Prescriptions: See MARs Over the Counter: See MARs History of alcohol / drug use?:  Yes Name of Substance 1: Alcohol 1 - Age of First Use: unknown 1 - Amount (size/oz): 3-4 12 oz beers 1 - Frequency: Weekends 1 - Duration: ongoing 1 - Last Use / Amount: 06/05/2019  Sleep: Good  Appetite:  Good  Current Medications: Current Facility-Administered Medications  Medication Dose Route Frequency Provider Last Rate Last Dose  . alum & mag hydroxide-simeth (MAALOX/MYLANTA) 200-200-20 MG/5ML suspension 30 mL  30 mL Oral Q4H PRN Mordecai Maes, NP      . fenofibrate tablet 160 mg  160 mg Oral Daily Nita Sells, MD   160 mg at 06/10/19 0802  . hydrochlorothiazide (MICROZIDE) capsule 12.5 mg  12.5 mg Oral Daily Sharma Covert, MD   12.5 mg at 06/10/19 0801  . lisinopril (ZESTRIL) tablet 20 mg  20 mg Oral Daily Sharma Covert, MD   20 mg at 06/10/19 0802  . pantoprazole (PROTONIX) EC tablet 40 mg  40 mg Oral Daily Sharma Covert, MD   40 mg at 06/10/19 0802  . pravastatin (PRAVACHOL) tablet 80 mg  80 mg Oral q1800 Nita Sells, MD   80 mg at 06/09/19 1721  . [START ON 06/11/2019] sertraline (ZOLOFT) tablet 50 mg  50 mg Oral Daily Zsazsa Bahena, Myer Peer, MD        Lab Results:  Results for orders placed or performed during the hospital encounter of 06/08/19 (from the past 48 hour(s))  SARS CORONAVIRUS 2 (TAT 6-24 HRS) Nasopharyngeal Nasopharyngeal Swab     Status: None   Collection Time: 06/08/19 11:05 AM   Specimen: Nasopharyngeal Swab  Result Value Ref Range   SARS Coronavirus 2 NEGATIVE NEGATIVE    Comment: (NOTE) SARS-CoV-2 target nucleic acids are NOT DETECTED. The SARS-CoV-2 RNA is generally detectable in upper and lower respiratory specimens during the acute phase of infection. Negative results do not preclude SARS-CoV-2 infection, do not rule out co-infections with other pathogens, and should not be used as the sole basis for treatment or other patient management decisions. Negative results must be combined with clinical observations, patient  history, and epidemiological information. The expected result is Negative. Fact Sheet for Patients: SugarRoll.be Fact Sheet for Healthcare Providers: https://www.woods-mathews.com/ This test is not yet approved or cleared by the [place] FDA and  has been authorized for detection and/or diagnosis of SARS-CoV-2 by FDA under an Emergency Use Authorization (EUA). This EUA will remain  in effect (meaning this test can be used) for the duration of the COVID-19 declaration under Section 56 4(b)(1) of the Act, 21 U.S.C. section 360bbb-3(b)(1), unless the authorization is terminated or revoked sooner. Performed at [facility], [place] [address] [place], [place] [address]   Comprehensive metabolic panel     Status: Abnormal   Collection Time: 06/08/19  6:37 PM  Result  Value Ref Range   Sodium 136 135 - 145 mmol/L   Potassium 3.8 3.5 - 5.1 mmol/L   Chloride 99 98 - 111 mmol/L   CO2 23 22 - 32 mmol/L   Glucose, Bld 94 70 - 99 mg/dL   BUN 11 6 - 20 mg/dL   Creatinine, Ser 0.78 0.61 - 1.24 mg/dL   Calcium 9.0 8.9 - 10.3 mg/dL   Total Protein 7.2 6.5 - 8.1 g/dL   Albumin 4.1 3.5 - 5.0 g/dL   AST 188 (H) 15 - 41 U/L   ALT 146 (H) 0 - 44 U/L   Alkaline Phosphatase 55 38 - 126 U/L   Total Bilirubin 1.2 0.3 - 1.2 mg/dL   GFR calc non Af Amer >60 >60 mL/min   GFR calc Af Amer >60 >60 mL/min   Anion gap 14 5 - 15    Comment: Performed at [facility], [place] [address] [place], [place] [address]  Ethanol     Status: None   Collection Time: 06/08/19  6:37 PM  Result Value Ref Range   Alcohol, Ethyl (B) <10 <10 mg/dL    Comment: (NOTE) Lowest detectable limit for serum alcohol is 10 mg/dL. For medical purposes only. Performed at [facility], [place] [address] [place], [place] [address]   Hemoglobin A1c     Status: None   Collection Time: 06/08/19  6:37 PM  Result Value Ref Range   Hgb A1c MFr Bld 5.4 4.8  - 5.6 %    Comment: (NOTE)         Prediabetes: 5.7 - 6.4         Diabetes: >6.4         Glycemic control for adults with diabetes: <7.0    Mean Plasma Glucose 108 mg/dL    Comment: (NOTE) Performed At: [facility] [place], [place] [id number] Rush Farmer MD [id number]   Lipid panel     Status: Abnormal   Collection Time: 06/08/19  6:37 PM  Result Value Ref Range   Cholesterol 162 0 - 200 mg/dL   Triglycerides 746 (H) <150 mg/dL   HDL 30 (L) >40 mg/dL   Total CHOL/HDL Ratio 5.4 RATIO   VLDL UNABLE TO CALCULATE IF TRIGLYCERIDE OVER 400 mg/dL 0 - 40 mg/dL   LDL Cholesterol UNABLE TO CALCULATE IF TRIGLYCERIDE OVER 400 mg/dL 0 - 99 mg/dL    Comment:        Total Cholesterol/HDL:CHD Risk Coronary Heart Disease Risk Table                     Men   Women  1/2 Average Risk   3.4   3.3  Average Risk       5.0   4.4  2 X Average Risk   9.6   7.1  3 X Average Risk  23.4   11.0        Use the calculated Patient Ratio above and the CHD Risk Table to determine the patient's CHD Risk.        ATP III CLASSIFICATION (LDL):  <100     mg/dL   Optimal  100-129  mg/dL   Near or Above                    Optimal  130-159  mg/dL   Borderline  160-189  mg/dL   High  >190     mg/dL   Very High  Performed at [facility], [place] [address], [place], [place] [address]   TSH     Status: None   Collection Time: 06/08/19  6:37 PM  Result Value Ref Range   TSH 3.599 0.350 - 4.500 uIU/mL    Comment: Performed by a 3rd Generation assay with a functional sensitivity of <=0.01 uIU/mL. Performed at [facility], [place] [address] [place], [place] [address]   Brain natriuretic peptide     Status: None   Collection Time: 06/08/19  6:37 PM  Result Value Ref Range   B Natriuretic Peptide 31.4 0.0 - 100.0 pg/mL    Comment: Performed at [facility], [place] [address] [place], [place] [address]  CBC     Status: None    Collection Time: 06/08/19  6:37 PM  Result Value Ref Range   WBC 8.1 4.0 - 10.5 K/uL   RBC 4.88 4.22 - 5.81 MIL/uL   Hemoglobin 15.8 13.0 - 17.0 g/dL   HCT 46.2 39.0 - 52.0 %   MCV 94.7 80.0 - 100.0 fL   MCH 32.4 26.0 - 34.0 pg   MCHC 34.2 30.0 - 36.0 g/dL   RDW 12.3 11.5 - 15.5 %   Platelets 269 150 - 400 K/uL   nRBC 0.0 0.0 - 0.2 %    Comment: Performed at [facility], [place] [address], [place], [place] [address]  LDL cholesterol, direct     Status: None   Collection Time: 06/08/19  6:37 PM  Result Value Ref Range   Direct LDL 30.0 0 - 99 mg/dL    Comment: Performed at [place] [address], [place], [place] [address]  Hepatic function panel     Status: Abnormal   Collection Time: 06/10/19  6:40 AM  Result Value Ref Range   Total Protein 7.7 6.5 - 8.1 g/dL   Albumin 4.2 3.5 - 5.0 g/dL   AST 86 (H) 15 - 41 U/L   ALT 104 (H) 0 - 44 U/L   Alkaline Phosphatase 59 38 - 126 U/L   Total Bilirubin 2.5 (H) 0.3 - 1.2 mg/dL   Bilirubin, Direct 0.5 (H) 0.0 - 0.2 mg/dL   Indirect Bilirubin 2.0 (H) 0.3 - 0.9 mg/dL    Comment: Performed at [facility], [place] [address] [place], [place] [address]    Blood Alcohol level:  Lab Results  Component Value Date   ETH <10 58/05/9832    Metabolic Disorder Labs: Lab Results  Component Value Date   HGBA1C 5.4 06/08/2019   MPG 108 06/08/2019   No results found for: PROLACTIN Lab Results  Component Value Date   CHOL 162 06/08/2019   TRIG 746 (H) 06/08/2019   HDL 30 (L) 06/08/2019   CHOLHDL 5.4 06/08/2019   VLDL UNABLE TO CALCULATE IF TRIGLYCERIDE OVER 400 mg/dL 06/08/2019   LDLCALC UNABLE TO CALCULATE IF TRIGLYCERIDE OVER 400 mg/dL 06/08/2019    Physical Findings: AIMS: Facial and Oral Movements Muscles of Facial Expression: None, normal Lips and Perioral Area: None, normal Jaw: None, normal Tongue: None, normal,Extremity Movements Upper (arms, wrists, hands, fingers): None,  normal Lower (legs, knees, ankles, toes): None, normal, Trunk Movements Neck, shoulders, hips: None, normal, Overall Severity Severity of abnormal movements (highest score from questions above): None, normal Incapacitation due to abnormal movements: None, normal Patient's awareness of abnormal movements (rate only patient's report): No Awareness, Dental Status Current problems with teeth and/or dentures?: No Does patient usually wear  dentures?: No  CIWA:  CIWA-Ar Total: 2 COWS:  COWS Total Score: 3  Musculoskeletal: Strength & Muscle Tone: within normal limits- no tremors, no diaphoresis Gait & Station: normal Patient leans: N/A  Psychiatric Specialty Exam: Physical Exam  ROS denies headache , denies chest pain, no shortness of breath at room air, no vomiting, no fever  Blood pressure 108/86, pulse (!) 120, temperature 98.4 F (36.9 C), temperature source Oral, resp. rate 16, height _0  (1.778 m), weight 105.2 kg, SpO2 100 %.Body mass index is 33.29 kg/m.     General Appearance: Well Groomed  Eye Contact:  Good  Speech:  Normal Rate  Volume:  Normal  Mood:  Reports feeling better  Affect:  Vaguely anxious although affect improves during session, reactive  Thought Process:  Linear and Descriptions of Associations: Intact  Orientation:  Other:  fully alert and attentive   Thought Content:  no hallucinations, no delusions   Suicidal Thoughts:  No currently denies suicidal or self injurious ideations,denies homicidal or violent ideations. Contracts for safety on unit   Homicidal Thoughts:  No  Memory:  recent and remote grossly intact   Judgement:   improving   Insight:  Fair/improving  Psychomotor Activity:  Normal- no restlessness or agitation, no tremors or diaphoresis  Concentration:  Concentration: Good and Attention Span: Good  Recall:  Good  Fund of Knowledge:  Good  Language:  Good  Akathisia:  Negative  Handed:  Right  AIMS (if indicated):     Assets:   Communication Skills Desire for Improvement Resilience  ADL's:  Intact  Cognition:  WNL  Sleep:  Number of Hours: 6.25   Assessment -  23 year old employed male, reports coworkers had called authorities for safety check at his home and acknowledges he had told a coworker he had suicidal thoughts.  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  Patient is presenting with improving mood and range of affect.  Currently denies suicidal ideations and presents future oriented, hoping to return to work soon and continue caring for his mother.  Behavior on unit in good control.  Tolerating Zoloft well thus far.  Of note QTC is prolonged at 507.  AST/ALT are trending down.  Treatment Plan Summary: Daily contact with patient to assess and evaluate symptoms and progress in treatment, Medication management, Plan inpatient treatment and medications as below  Treatment plan reviewed as below today 9/12 Encourage group and milieu participation Continue HCTZ 12.5 mg daily and lisinopril 20 mg daily for hypertension Continue pravachol 80 mg daily and  fenofibrate 160 mg daily for hyperlipidemia/hypertriglyceridemia Will discontinue trazodone and Vistaril PRN's based on QTC finding For now we will hold Zoloft as well, although this SSRI considered to be low risk for QTc prolongation.  Repeat EKG Treatment team working on disposition planning options   Jenne Campus, MD 06/10/2019, 9:19 AM   Patient ID: [name], male   DOB: July 05, 1966, 52 y.o.   MRN: [id number]

## 2019-06-10 NOTE — BHH Group Notes (Signed)
[facility] LCSW Group Therapy Note  Date/Time:    06/10/2019 10:00-11:00AM  Type of Therapy and Topic:  Group Therapy:  Healthy vs Unhealthy Coping Skills  Participation Level:  Active   Description of Group:  The focus of this group was to determine what unhealthy coping techniques typically are used by group members and what healthy coping techniques would be helpful in coping with various problems. Patients were guided in becoming aware of the differences between healthy and unhealthy coping techniques.  Facilitator led a discussion about Cognitive-Behavioral Therapy and the way in which the triangle of thoughts/feelings/actions interact.  Therapeutic Goals 1. Patients learned that coping is what human beings do all day long to deal with various situations in their lives 2. Patients defined and discussed healthy vs unhealthy coping techniques 3. Patients came to understand the interaction between thoughts, feelings, and actions and how changing one can change the others 4. Patients provided support and ideas to each other  Summary of Patient Progress: During group, patient expressed that he anticipates his depressive symptoms being barriers to his success at discharge, saying that he knows he will need to reacclimate initially.  He stated he tends to dwell on negative thoughts and go further and further down in his thinking.  He is looking forward to talking with his pastor at church about what groups and volunteer opportunities are available at the church in order to fill his time with positive activities instead of staying in his negative thoughts.   Therapeutic Modalities Cognitive Behavioral Therapy Motivational Interviewing   Selmer Dominion, LCSW 06/10/2019, 11:27 AM

## 2019-06-10 NOTE — Progress Notes (Signed)
Adult Psychoeducational Group Note  Date:  06/10/2019 Time:  10:05 PM  Group Topic/Focus:  Wrap-Up Group:   The focus of this group is to help patients review their daily goal of treatment and discuss progress on daily workbooks.  Participation Level:  Active  Participation Quality:  Appropriate  Affect:  Appropriate  Cognitive:  Appropriate  Insight: Appropriate  Engagement in Group:  Engaged  Modes of Intervention:  Discussion  Additional Comments:  Pt stated his goal for today was to be level headed and mindful of others.  Pt stated he did meet his goal and rated the day at a 8/10.  Shaden Higley 06/10/2019, 10:05 PM

## 2019-06-10 NOTE — Progress Notes (Signed)
D: Patient has been cooperative; he is observed in the day room interacting well with staff.  Patient's mood appears stable today.  Patient denies any SI/HI/AVH.  He hopes to be discharged tomorrow.  EKG complete and reviewed with MD.  Patient rates his depression, hopelessness, and anxiety as a 0.  A: Continue to monitor medication management and MD orders.  Safety checks completed every 15 minutes per protocol.  Offer support and encouragement as needed.  R: Patient is receptive to staff; his behavior is appropriate.

## 2019-06-11 MED ORDER — HYDROCHLOROTHIAZIDE 12.5 MG PO CAPS: 12.5000 mg | ORAL_CAPSULE | Freq: Every day | ORAL | 0 refills | Status: DC

## 2019-06-11 MED ORDER — DULOXETINE HCL 30 MG PO CPEP: 30.0000 mg | ORAL_CAPSULE | Freq: Every day | ORAL | 0 refills | Status: DC

## 2019-06-11 MED ORDER — FENOFIBRATE 160 MG PO TABS: 160.0000 mg | ORAL_TABLET | Freq: Every day | ORAL | 0 refills | Status: DC

## 2019-06-11 MED ORDER — LISINOPRIL 20 MG PO TABS: 20.0000 mg | ORAL_TABLET | Freq: Every day | ORAL | 0 refills | Status: DC

## 2019-06-11 MED ORDER — DULOXETINE HCL 30 MG PO CPEP
30.0000 mg | ORAL_CAPSULE | Freq: Every day | ORAL | Status: DC
  Administered 2019-06-11: 30 mg via ORAL
  Filled 2019-06-11 (×4): qty 1

## 2019-06-11 MED ORDER — PANTOPRAZOLE SODIUM 40 MG PO TBEC: 40.0000 mg | DELAYED_RELEASE_TABLET | Freq: Every day | ORAL | 0 refills | Status: DC

## 2019-06-11 MED ORDER — PRAVASTATIN SODIUM 80 MG PO TABS: 80.0000 mg | ORAL_TABLET | Freq: Every day | ORAL | 0 refills | Status: DC

## 2019-06-11 NOTE — Discharge Summary (Addendum)
Physician Discharge Summary Note  Patient:  Anthony Golden is an 53 y.o., male MRN:  [id number] DOB:  1966/06/16 Patient phone:  [phone] (home)  Patient address:   [address] [place] [address],  Total Time spent with patient: >30 minutes  Date of Admission:  06/08/2019 Date of Discharge: 06/11/2019  Reason for Admission:  Suicidal ideations  Principal Problem: MDD (major depressive disorder) Discharge Diagnoses: Principal Problem:   MDD (major depressive disorder)   Past Psychiatric History:   Past Medical History:  Past Medical History:  Diagnosis Date  . Hypertension   . Sleep apnea    Does not use CPAP    Past Surgical History:  Procedure Laterality Date  . TMJ ARTHROSCOPY     Family History:  Family History  Problem Relation Age of Onset  . Cancer Mother        Lung cancer and colon cancer  . COPD Mother   . Heart disease Father        Died suddenly age 79.     Family Psychiatric  History: See H&P Social History:  Social History   Substance and Sexual Activity  Alcohol Use Yes   Comment: Pt report occasianly drinking 1/2 beers per month     Social History   Substance and Sexual Activity  Drug Use No    Social History   Socioeconomic History  . Marital status: Single    Spouse name: Not on file  . Number of children: Not on file  . Years of education: Not on file  . Highest education level: Not on file  Occupational History  . Not on file  Social Needs  . Financial resource strain: Not on file  . Food insecurity    Worry: Not on file    Inability: Not on file  . Transportation needs    Medical: Not on file    Non-medical: Not on file  Tobacco Use  . Smoking status: Never Smoker  . Smokeless tobacco: Never Used  Substance and Sexual Activity  . Alcohol use: Yes    Comment: Pt report occasianly drinking 1/2 beers per month  . Drug use: No  . Sexual activity: Not Currently  Lifestyle  . Physical activity    Days per week:  Not on file    Minutes per session: Not on file  . Stress: Not on file  Relationships  . Social Herbalist on phone: Not on file    Gets together: Not on file    Attends religious service: Not on file    Active member of club or organization: Not on file    Attends meetings of clubs or organizations: Not on file    Relationship status: Not on file  Other Topics Concern  . Not on file  Social History Narrative  . Not on file    Hospital Course:  Per  MDs Admission Assessment Patient is a 53 year old male with essentially negative past psychiatric history and a past medical history significant for congestive heart failure, hypertension, sleep apnea and osteoarthritis who presented to the behavioral health hospital when his coworkers called for safety check at his home. He stated he felt like his life was spiraling out of control. The patient stated recent stressors had been going through a divorce over the last 2 years after 27 years of marriage, being alone, caring for his ill mother, and just overall having helplessness, hopelessness and worthlessness. When the police went for the safety  check he admitted to suicidal ideation, and apparently had a weapon in the home and was thinking about putting the gun to his head. He stated that he had told his coworkers about his suicidal thoughts and they were significantly worried about him. Also he had a dog that he found dead approximately 1 to 2 weeks prior to admission. He also mentioned that his stepson had committed suicide approximately a year ago May. He was admitted to the hospital for evaluation and stabilization.  Physical Findings: AIMS: Facial and Oral Movements Muscles of Facial Expression: None, normal Lips and Perioral Area: None, normal Jaw: None, normal Tongue: None, normal,Extremity Movements Upper (arms, wrists, hands, fingers): None, normal Lower (legs, knees, ankles, toes): None, normal, Trunk Movements Neck,  shoulders, hips: None, normal, Overall Severity Severity of abnormal movements (highest score from questions above): None, normal Incapacitation due to abnormal movements: None, normal Patient's awareness of abnormal movements (rate only patient's report): No Awareness, Dental Status Current problems with teeth and/or dentures?: No Does patient usually wear dentures?: No  CIWA:  CIWA-Ar Total: 2 COWS:  COWS Total Score: 3  Musculoskeletal: Strength & Muscle Tone: within normal limits Gait & Station: normal Patient leans: N/A  Psychiatric Specialty Exam: Physical Exam  Constitutional: He is oriented to person, place, and time. He appears well-developed.  Neck: Normal range of motion.  Musculoskeletal: Normal range of motion.  Neurological: He is alert and oriented to person, place, and time.    Review of Systems  Constitutional: Negative for chills and fever.  Psychiatric/Behavioral: Positive for depression (stable with medication).    Blood pressure (!) 127/98, pulse (!) 107, temperature 98 F (36.7 C), temperature source Oral, resp. rate 20, height 5\' 10"  (1.778 m), weight 106.1 kg, SpO2 97 %.Body mass index is 33.58 kg/m.   See MDs discharge SRA  Have you used any form of tobacco in the last 30 days? (Cigarettes, Smokeless Tobacco, Cigars, and/or Pipes): No  Has this patient used any form of tobacco in the last 30 days? (Cigarettes, Smokeless Tobacco, Cigars, and/or Pipes) NoN/A  Blood Alcohol level:  Lab Results  Component Value Date   ETH <10 [id number]    Metabolic Disorder Labs:  Lab Results  Component Value Date   HGBA1C 5.4 06/08/2019   MPG 108 06/08/2019   No results found for: PROLACTIN Lab Results  Component Value Date   CHOL 162 06/08/2019   TRIG 746 (H) 06/08/2019   HDL 30 (L) 06/08/2019   CHOLHDL 5.4 06/08/2019   VLDL UNABLE TO CALCULATE IF TRIGLYCERIDE OVER 400 mg/dL 06/08/2019   LDLCALC UNABLE TO CALCULATE IF TRIGLYCERIDE OVER 400 mg/dL 06/08/2019     See Psychiatric Specialty Exam and Suicide Risk Assessment completed by Attending Physician prior to discharge.  Discharge destination:  Home  Is patient on multiple antipsychotic therapies at discharge:  No   Has Patient had three or more failed trials of antipsychotic monotherapy by history:  No  Recommended Plan for Multiple Antipsychotic Therapies: NA  Discharge Instructions    Diet - low sodium heart healthy   Complete by: As directed    Increase activity slowly   Complete by: As directed      Allergies as of 06/11/2019      Reactions   Bee Venom Hives      Medication List    STOP taking these medications   ALPRAZolam 0.5 MG tablet Commonly known as: XANAX   lisinopril-hydrochlorothiazide 20-12.5 MG tablet Commonly known as: ZESTORETIC  TAKE these medications     Indication  DULoxetine 30 MG capsule Commonly known as: CYMBALTA Take 1 capsule (30 mg total) by mouth daily. Start taking on: June 12, 2019  Indication: Major Depressive Disorder   fenofibrate 160 MG tablet Take 1 tablet (160 mg total) by mouth daily. Start taking on: June 12, 2019  Indication: High Amount of Fats in the Blood   fluticasone 50 MCG/ACT nasal spray Commonly known as: FLONASE Place 1 spray into both nostrils daily as needed for allergies or rhinitis.  Indication: Allergic Rhinitis   hydrochlorothiazide 12.5 MG capsule Commonly known as: MICROZIDE Take 1 capsule (12.5 mg total) by mouth daily. Start taking on: June 12, 2019  Indication: High Blood Pressure Disorder   lisinopril 20 MG tablet Commonly known as: ZESTRIL Take 1 tablet (20 mg total) by mouth daily. Start taking on: June 12, 2019  Indication: High Blood Pressure Disorder   pantoprazole 40 MG tablet Commonly known as: PROTONIX Take 1 tablet (40 mg total) by mouth daily. Start taking on: June 12, 2019  Indication: Gastroesophageal Reflux Disease   pravastatin 80 MG  tablet Commonly known as: PRAVACHOL Take 1 tablet (80 mg total) by mouth daily at 6 PM. What changed:   medication strength  how much to take  when to take this  Indication: High Amount of Fats in the [place] Follow up on 06/14/2019.   Why: Therapy with Elta Guadeloupe is Wednesay, 9/16 at 10:00a.  Medication management with Sharyn Lull is Thursday, 9/17 at 10:00a.  Appts will be virtual. Please call within 24 hours of discharge to pay $20 deposit and provide email address. Thank you.  Contact information: [address] [place] [address] [phone]           Follow-up recommendations:  Activity:  as tolerated Diet:  low fat, low cholesterol Other:  follow-up with PCP as recommended   Comments:   Discharge home.  The patient appears reasonably screened and/or stabilized for discharge and does not appear to have emergency medical/psychiatric concerns/conditions requiring further screening, evaluation, or treatment at this time prior to discharge.    Signed: Mallie Darting, NP 06/11/2019, 10:04 AM   Patient seen, Suicide Assessment Completed.  Disposition Plan Reviewed

## 2019-06-11 NOTE — Progress Notes (Signed)
D. Pt pleasant on approach, hopeful for discharge tomorrow.  Pt states he really wants to go see his mother.  Pt was positive for evening wrap up group, observed engaged appropriately with peers on the unit.  Pt denies SI/HI/AVH at this time.  A.  Support and encouragement offered, medication given as ordered.  R,  Pt remains safe on the unit, will continue to monitor.

## 2019-06-11 NOTE — Progress Notes (Signed)
D. Pt is calm and cooperative, friendly during interactions. Per pt's self inventory, pt rated his depression, hopelessness and anxiety all 0's. Pt currently denies pain, SI/HI and AVH A. Labs and vitals monitored. Pt given and educated on medications. Pt supported emotionally and encouraged to express concerns and ask questions.   R. Pt remains safe with 15 minute checks. Will continue POC.

## 2019-06-11 NOTE — [facility] Group Notes (Signed)
LCSW Group Therapy Note  06/11/2019  10:00-11:00AM  Group Therapy/Topic:   Adding Supports Including Yourself  Participation Level:  Active   Description of Group: Patients in this group were introduced to the concept that additional supports including self-support are an essential part of recovery.  Patients listed their current healthy and unhealthy supports, and were asked to consider adding additional supports to help them achieve their goals at discharge.  A song entitled "My Own Hero" was played and a group discussion ensued in which patients stated they could relate to the song and it inspired them to realize they have to be willing to help themselves in order to succeed, because other people cannot achieve sobriety or stability for them.  A song was played called "I Am Enough" which led to a discussion about being willing to believe in their own worth.  Therapeutic Goals: 1)  demonstrate the importance of being a key part of one's own support system 2)  discuss various available supports 3)  encourage patient to use music as part of their self-support and focus on goals 4)  elicit ideas from patients about supports that need to be added   Summary of Patient Progress:  The patient expressed his current healthy supports are his family, friends, co-workers, Theme park manager and some employees, while unhealthy supports include himself at times.  A support he would like to add is additional church-based activities and himself.   Therapeutic Modalities:   Motivational Interviewing Activity  Maretta Los

## 2019-06-11 NOTE — Progress Notes (Signed)
Pt discharged to lobby. Pt was stable and appreciative at that time. All papers and prescriptions were given and valuables returned. Verbal understanding expressed. Denies SI/HI and A/VH. Pt given opportunity to express concerns and ask questions.  

## 2019-06-11 NOTE — Progress Notes (Signed)
[place] NOVEL CORONAVIRUS (COVID-19) DAILY CHECK-OFF SYMPTOMS - answer yes or no to each - every day NO YES  Have you had a fever in the past 24 hours?  . Fever (Temp > 37.80C / 100F) X   Have you had any of these symptoms in the past 24 hours? . New Cough .  Sore Throat  .  Shortness of Breath .  Difficulty Breathing .  Unexplained Body Aches   X   Have you had any one of these symptoms in the past 24 hours not related to allergies?   . Runny Nose .  Nasal Congestion .  Sneezing   X   If you have had runny nose, nasal congestion, sneezing in the past 24 hours, has it worsened?  X   EXPOSURES - check yes or no X   Have you traveled outside the state in the past 14 days?  X   Have you been in contact with someone with a confirmed diagnosis of COVID-19 or PUI in the past 14 days without wearing appropriate PPE?  X   Have you been living in the same home as a person with confirmed diagnosis of COVID-19 or a PUI (household contact)?    X   Have you been diagnosed with COVID-19?    X              What to do next: Answered NO to all: Answered YES to anything:   Proceed with unit schedule Follow the [facility] Inpatient Flowsheet.   

## 2019-06-11 NOTE — [facility] Suicide Risk Assessment (Addendum)
[facility] Discharge Suicide Risk Assessment   Principal Problem:  Depression Discharge Diagnoses: Active Problems:   MDD (major depressive disorder)   Total Time spent with patient: 30 minutes  Musculoskeletal: Strength & Muscle Tone: within normal limits Gait & Station: normal Patient leans: N/A  Psychiatric Specialty Exam: ROS denies chest pain or shortness of breath, no nausea, no vomiting, no fever, no chills  Blood pressure (!) 127/98, pulse (!) 107, temperature 98 F (36.7 C), temperature source Oral, resp. rate 20, height 5\' 10"  (1.778 m), weight 106.1 kg, SpO2 97 %.Body mass index is 33.58 kg/m.  General Appearance: Casual  Eye Contact::  Good  Speech:  Normal Rate409  Volume:  Normal  Mood:  Reports improving mood and states "I feel pretty good today", denies depression at this time  Affect:  Appropriate and Reactive  Thought Process:  Linear and Descriptions of Associations: Intact  Orientation:  Other:  Fully alert and attentive  Thought Content:  No hallucinations, no delusions  Suicidal Thoughts:  No-currently denies suicidal or self-injurious ideations, also denies homicidal or violent ideations  Homicidal Thoughts:  No  Memory:  Recent and remote grossly intact  Judgement:  Other:  Improving  Insight:  Improving  Psychomotor Activity:  Normal  Concentration:  Good  Recall:  Good  Fund of Knowledge:Good  Language: Good  Akathisia:  Negative  Handed:  Right  AIMS (if indicated):     Assets:  Communication Skills Resilience  Sleep:  Number of Hours: 6.25  Cognition: WNL  ADL's:  Intact   Mental Status Per Nursing Assessment::   On Admission:  Self-harm thoughts, Intention to act on suicide plan  Demographic Factors:  53 year old male, employed  Loss Factors:  Divorced, caregiver for his elderly mother, pet dog died recently  Historical Factors: No prior psychiatric admissions, denies prior history of suicide attempts  Risk Reduction Factors:   Sense  of responsibility to family, Employed and Positive coping skills or problem solving skills  Continued Clinical Symptoms:  Currently patient is alert, attentive, calm, pleasant on approach, mood is improved and currently denies feeling depressed and appears euthymic, affect is appropriate and full in range, no thought disorder, denies any suicidal ideations and presents future oriented, planning on returning to work early next week and on continue to help his elderly mother with medical appointments, etc.  Denies homicidal ideations.  No psychotic symptoms. Behavior on unit calm, pleasant on approach. Patient had prolonged QTc at 507 on routine EKG. because of this trazodone, Vistaril PRN's and Zoloft were discontinued.  Repeat EKG improved (QTc 490).  We reviewed treatment options with patient and agreed to start Cymbalta trial (it is felt duloxetine less likely to be associated with QTc changes).  Side effects including risk of hyponatremia reviewed.  Will start Cymbalta at 30 mg daily, patient to continue following up with outpatient psychiatrist. LFTs improved compared to admission.  Have reviewed elevated transaminase findings and hyperlipidemia with patient as well as adjustments made during his admission to protocol those and addition of fenofibrate.  Side effects reviewed.  Patient encouraged to follow-up with PCP for further monitoring and management  Cognitive Features That Contribute To Risk:  No gross cognitive deficits noted upon discharge. Is alert , attentive, and oriented x 3   Suicide Risk:  Mild:  Suicidal ideation of limited frequency, intensity, duration, and specificity.  There are no identifiable plans, no associated intent, mild dysphoria and related symptoms, good self-control (both objective and subjective assessment), few other risk  factors, and identifiable protective factors, including available and accessible social support.  [place]  Follow up on 06/14/2019.   Why: Therapy with Elta Guadeloupe is Wednesay, 9/16 at 10:00a.  Medication management with Sharyn Lull is Thursday, 9/17 at 10:00a.  Appts will be virtual. Please call within 24 hours of discharge to pay $20 deposit and provide email address. Thank you.  Contact information: [address] [place] [address] [phone]           Plan Of Care/Follow-up recommendations:  Activity:  As tolerated Diet:  Heart healthy Tests:  NA Other:  See below Patient expressing readiness for discharge, discharge in good spirits, plans to return home.  Follow-up as above.  Patient also plans to follow-up with PCP for further medical management as above. Jenne Campus, MD 06/11/2019, 9:47 AM

## 2019-06-11 NOTE — Progress Notes (Signed)
  [facility] Adult Case Management Discharge Plan :  Will you be returning to the same living situation after discharge:  Yes,  lives alone At discharge, do you have transportation home?: Yes,  arranged by patient Do you have the ability to pay for your medications: Yes,  states there are no barriers  Release of information consent forms completed and turned in to Medical Records by CSW.   Patient to Follow up at: [place] Follow up on 06/14/2019.   Why: Therapy with Elta Guadeloupe is Wednesay, 9/16 at 10:00a.  Medication management with Sharyn Lull is Thursday, 9/17 at 10:00a.  Appts will be virtual. Please call within 24 hours of discharge to pay $20 deposit and provide email address. Thank you.  Contact information: [address] [place] [address] [phone]           Next level of care provider has access to [employer] and Suicide Prevention discussed: Yes,  with mother  Have you used any form of tobacco in the last 30 days? (Cigarettes, Smokeless Tobacco, Cigars, and/or Pipes): No  Has patient been referred to the Quitline?: N/A patient is not a smoker  Patient has been referred for addiction treatment: N/A  Maretta Los, LCSW 06/11/2019, 10:01 AM

## 2019-06-12 LAB — HEPATITIS B SURFACE ANTIGEN: Hepatitis B Surface Ag: NEGATIVE

## 2019-06-12 LAB — HEPATITIS C ANTIBODY: HCV Ab: <0.1 s/co ratio (ref 0.0–0.9)

## 2019-06-13 DIAGNOSIS — F4321 Adjustment disorder with depressed mood: Secondary | ICD-10-CM | POA: Diagnosis not present

## 2019-06-23 ENCOUNTER — Other Ambulatory Visit: Payer: Self-pay | Admitting: Cardiology

## 2019-06-29 DIAGNOSIS — F4321 Adjustment disorder with depressed mood: Secondary | ICD-10-CM | POA: Diagnosis not present

## 2019-07-17 ENCOUNTER — Encounter: Payer: Self-pay | Admitting: Cardiovascular Disease

## 2019-07-17 ENCOUNTER — Other Ambulatory Visit: Payer: Self-pay

## 2019-07-17 ENCOUNTER — Ambulatory Visit: Payer: Federal, State, Local not specified - PPO | Admitting: Cardiovascular Disease

## 2019-07-17 VITALS — BP 139/90 | HR 92 | Temp 97.7°F | Ht 70.0 in | Wt 241.4 lb

## 2019-07-17 DIAGNOSIS — I10 Essential (primary) hypertension: Secondary | ICD-10-CM

## 2019-07-17 DIAGNOSIS — E782 Mixed hyperlipidemia: Secondary | ICD-10-CM

## 2019-07-17 DIAGNOSIS — E6609 Other obesity due to excess calories: Secondary | ICD-10-CM

## 2019-07-17 DIAGNOSIS — R7989 Other specified abnormal findings of blood chemistry: Secondary | ICD-10-CM

## 2019-07-17 DIAGNOSIS — Z79899 Other long term (current) drug therapy: Secondary | ICD-10-CM

## 2019-07-17 DIAGNOSIS — R0789 Other chest pain: Secondary | ICD-10-CM

## 2019-07-17 DIAGNOSIS — G4730 Sleep apnea, unspecified: Secondary | ICD-10-CM

## 2019-07-17 DIAGNOSIS — Z6834 Body mass index (BMI) 34.0-34.9, adult: Secondary | ICD-10-CM

## 2019-07-17 NOTE — Patient Instructions (Signed)
Medication Instructions:  The current medical regimen is effective;  continue present plan and medications as directed. Please refer to the Current Medication list given to you today. If you need a refill on your cardiac medications before your next appointment, please call your pharmacy.  Testing/Procedures: Your physician has recommended that you have a sleep study. This test records several body functions during sleep, including: brain activity, eye movement, oxygen and carbon dioxide blood levels, heart rate and rhythm, breathing rate and rhythm, the flow of air through your mouth and nose, snoring, body muscle movements, and chest and belly movement.  Follow-Up: IN 3-4 months-FOR SLEEP DISCUSSION In Person You may see Shelva Majestic or one of the following Advanced Practice Providers on your designated Care Team:  Rosaria Ferries, PA-C Jory Sims, DNP, ANP Cadence Kathlen Mody, NP  .     At [facility], you and your health needs are our priority.  As part of our continuing mission to provide you with exceptional heart care, we have created designated Provider Care Teams.  These Care Teams include your primary Cardiologist (physician) and Advanced Practice Providers (APPs -  Physician Assistants and Nurse Practitioners) who all work together to provide you with the care you need, when you need it.  Thank you for choosing [facility] at [facility]!!

## 2019-07-19 NOTE — Progress Notes (Signed)
Cardiology Office Note    Date:  07/19/2019   ID:  Anthony Golden, DOB 1966/05/02, MRN [id number]  PCP:  Donald Prose, MD  Cardiologist:  Shelva Majestic, MD (sleep); Dr. Percival Spanish  New sleep evaluation  History of Present Illness:  Anthony Golden is a 53 y.o. male who and anxiety.  Is a former patient of Dr. Rex Kras and last saw him in 2011.  He now sees Dr. Percival Spanish for his cardiology care.  Mr. Redd apparently has a history of obstructive sleep apnea.  Apparently, around 2010  he was diagnosed with obstructive sleep apnea.  He recalls being told of having severe sleep apnea and thinks his AHI was in the 40s. He states he used CPAP therapy for approximately 2 years and then stopped using it he has a history of hypertension, palpitations, and anxiety.  He had seen Dr. Percival Spanish in the past for an elevated coronary calcium score and had experienced significant increase stress having gone through a divorce.  A routine treadmill test reportedly was negative.  The patient recently admits to very poor sleep.  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  He typically goes to bed between 9 and 10 PM and wakes up at 6 AM but throughout the night he has frequent awakenings and often has significant nocturia waking up at least 5 or 6 times per night.  His sleep is nonrestorative.  He does not do any naps due to his work.  Distress has improved since his divorce.  He lives by himself and has no children.  He drinks occasional beer and wine.  He now presents for reevaluation of his obstructive sleep apnea.  An Epworth Sleepiness Scale score was calculated in the office today and this endorsed at 8 as shown below:  Epworth Sleepiness Scale: Situation   Chance of Dozing/Sleeping (0 = never , 1 = slight chance , 2 = moderate chance , 3 = high chance )   sitting and reading 2   watching TV 2   sitting inactive in a public place 0   being a  passenger in a motor vehicle for an hour or more 2   lying down in the afternoon 2   sitting and talking to someone 0   sitting quietly after lunch (no alcohol) 0   while stopped for a few minutes in traffic as the driver 0   Total Score  8   He is unaware of bruxism.  He denies any awareness of restless legs.  He is unaware of sleep paralysis, hypnagogic hallucinations or cataplectic events.  Past Medical History:  Diagnosis Date  . Hypertension   . Sleep apnea    Does not use CPAP    Past Surgical History:  Procedure Laterality Date  . TMJ ARTHROSCOPY      Current Medications: Outpatient Medications Prior to Visit  Medication Sig Dispense Refill  . fluticasone (FLONASE) 50 MCG/ACT nasal spray Place 1 spray into both nostrils daily as needed for allergies or rhinitis.    . hydrochlorothiazide (MICROZIDE) 12.5 MG capsule Take 1 capsule (12.5 mg total) by mouth daily. 30 capsule 0  . pravastatin (PRAVACHOL) 40 MG tablet TAKE 1 TABLET(40 MG) BY MOUTH EVERY EVENING 90 tablet 3  . DULoxetine (CYMBALTA) 30 MG capsule Take 1 capsule (30 mg total) by mouth daily. (Patient not taking: Reported on 07/17/2019) 30 capsule 0  .  fenofibrate 160 MG tablet Take 1 tablet (160 mg total) by mouth daily. (Patient not taking: Reported on 07/17/2019) 30 tablet 0  . lisinopril (ZESTRIL) 20 MG tablet Take 1 tablet (20 mg total) by mouth daily. (Patient not taking: Reported on 07/17/2019) 30 tablet 0  . pantoprazole (PROTONIX) 40 MG tablet Take 1 tablet (40 mg total) by mouth daily. (Patient not taking: Reported on 07/17/2019) 30 tablet 0   No facility-administered medications prior to visit.      Allergies:   Bee venom   Social History   Socioeconomic History  . Marital status: Single    Spouse name: Not on file  . Number of children: Not on file  . Years of education: Not on file  . Highest education level: Not on file  Occupational History  . Not on file  Social Needs  . Financial resource  strain: Not on file  . Food insecurity    Worry: Not on file    Inability: Not on file  . Transportation needs    Medical: Not on file    Non-medical: Not on file  Tobacco Use  . Smoking status: Never Smoker  . Smokeless tobacco: Never Used  Substance and Sexual Activity  . Alcohol use: Yes    Comment: Pt report occasianly drinking 1/2 beers per month  . Drug use: No  . Sexual activity: Not Currently  Lifestyle  . Physical activity    Days per week: Not on file    Minutes per session: Not on file  . Stress: Not on file  Relationships  . Social Herbalist on phone: Not on file    Gets together: Not on file    Attends religious service: Not on file    Active member of club or organization: Not on file    Attends meetings of clubs or organizations: Not on file    Relationship status: Not on file  Other Topics Concern  . Not on file  Social History Narrative  . Not on file     Family History:  The patient's family history includes COPD in his mother; Cancer in his mother; Heart disease in his father.   His father died at age 11 with heart disease.  Mother is living at age 84.  He has 4 brothers and 1 sister.  1 brother cancer.  ROS General: Negative; No fevers, chills, or night sweats;  HEENT: Negative; No changes in vision or hearing, sinus congestion, difficulty swallowing Pulmonary: Negative; No cough, wheezing, shortness of breath, hemoptysis Cardiovascular: Positive for hypertension atypical chest pain occasional palpitations GI: Negative; No nausea, vomiting, diarrhea, or abdominal pain GU: Negative; No dysuria, hematuria, or difficulty voiding Musculoskeletal: Negative; no myalgias, joint pain, or weakness Hematologic/Oncology: Negative; no easy bruising, bleeding Endocrine: Negative; no heat/cold intolerance; no diabetes Neuro: Negative; no changes in balance, headaches Skin: Negative; No rashes or skin lesions Psychiatric: History of anxiety Sleep:  History of sleep apnea, previously severe, positive snoring, frequent nocturia no daytime sleepiness, hypersomnolence, bruxism, restless legs, hypnogognic hallucinations, no cataplexy Other comprehensive 14 point system review is negative.   PHYSICAL EXAM:   VS:  BP 139/90   Pulse 92   Temp 97.7 F (36.5 C)   Ht _0  (1.778 m)   Wt 241 lb 6.4 oz (109.5 kg)   SpO2 97%   BMI 34.64 kg/m     Repeat pressure by me was 126/86  Wt Readings from Last 3 Encounters:  07/17/19 241  lb 6.4 oz (109.5 kg)  08/18/18 243 lb 3.2 oz (110.3 kg)  05/10/18 213 lb 12.8 oz (97 kg)    General: Alert, oriented, no distress.  Skin: normal turgor, no rashes, warm and dry HEENT: Normocephalic, atraumatic. Pupils equal round and reactive to light; sclera anicteric; extraocular muscles intact;  Nose without nasal septal hypertrophy Mouth/Parynx benign; Mallinpatti scale 3 Neck: Thick neck; no JVD, no carotid bruits; normal carotid upstroke Lungs: clear to ausculatation and percussion; no wheezing or rales Chest wall: without tenderness to palpitation Heart: PMI not displaced, RRR, s1 s2 normal, 1/6 systolic murmur, no diastolic murmur, no rubs, gallops, thrills, or heaves Abdomen: soft, nontender; no hepatosplenomehaly, BS+; abdominal aorta nontender and not dilated by palpation. Back: no CVA tenderness Pulses 2+ Musculoskeletal: full range of motion, normal strength, no joint deformities Extremities: no clubbing cyanosis or edema, Homan's sign negative  Neurologic: grossly nonfocal; Cranial nerves grossly wnl Psychologic: Normal mood and affect   Studies/Labs Reviewed:   EKG:  EKG is ordered today.  ECG (independently read by me): Normal sinus rhythm at 86 bpm.  Left axis deviation.  Inferior Q waves.  Recent Labs: BMP Latest Ref Rng & Units 06/08/2019  Glucose 70 - 99 mg/dL 94  BUN 6 - 20 mg/dL 11  Creatinine 0.61 - 1.24 mg/dL 0.78  Sodium 135 - 145 mmol/L 136  Potassium 3.5 - 5.1 mmol/L 3.8   Chloride 98 - 111 mmol/L 99  CO2 22 - 32 mmol/L 23  Calcium 8.9 - 10.3 mg/dL 9.0     Hepatic Function Latest Ref Rng & Units 06/10/2019 06/08/2019  Total Protein 6.5 - 8.1 g/dL 7.7 7.2  Albumin 3.5 - 5.0 g/dL 4.2 4.1  AST 15 - 41 U/L 86(H) 188(H)  ALT 0 - 44 U/L 104(H) 146(H)  Alk Phosphatase 38 - 126 U/L 59 55  Total Bilirubin 0.3 - 1.2 mg/dL 2.5(H) 1.2  Bilirubin, Direct 0.0 - 0.2 mg/dL 0.5(H) -    CBC Latest Ref Rng & Units 06/08/2019  WBC 4.0 - 10.5 K/uL 8.1  Hemoglobin 13.0 - 17.0 g/dL 15.8  Hematocrit 39.0 - 52.0 % 46.2  Platelets 150 - 400 K/uL 269   Lab Results  Component Value Date   MCV 94.7 06/08/2019   Lab Results  Component Value Date   TSH 3.599 06/08/2019   Lab Results  Component Value Date   HGBA1C 5.4 06/08/2019     BNP    Component Value Date/Time   BNP 31.4 06/08/2019 1837    ProBNP No results found for: PROBNP   Lipid Panel     Component Value Date/Time   CHOL 162 06/08/2019 1837   TRIG 746 (H) 06/08/2019 1837   HDL 30 (L) 06/08/2019 1837   CHOLHDL 5.4 06/08/2019 1837   VLDL UNABLE TO CALCULATE IF TRIGLYCERIDE OVER 400 mg/dL 06/08/2019 1837   LDLCALC UNABLE TO CALCULATE IF TRIGLYCERIDE OVER 400 mg/dL 06/08/2019 1837   LDLDIRECT 30.0 06/08/2019 1837     RADIOLOGY: No results found.   Additional studies/ records that were reviewed today include:  I have reviewed the remote records of Dr. Rex Kras from 2010 and 2011.  Records of Dr. Percival Spanish were reviewed as were records from [facility], Maude Leriche, [place]    ASSESSMENT:    1. Severe sleep apnea   2. Hypertension, unspecified type   3. Essential hypertension   4. Class 1 obesity due to excess calories without serious comorbidity with body mass index (BMI) of 34.0 to 34.9  in adult   5. Elevated LFTs   6. Atypical chest pain   7. Mixed hyperlipidemia     PLAN:  Anthony Golden is a 53 year old gentleman who has a history of hypertension, obesity, hyperlipidemia, and  reportedly has a history of severe sleep apnea with an AHI in the range of greater than 40 by a sleep study over 10 years ago.  He apparently CPAP for 2 years but essentially stopped using it.  Over the last several years, he has noticed continued poor sleep.  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  His sleep is very poor.  He wakes up at least for 5-6 times per night for urination.  Sleep is nonrestorative.  At present his blood pressure today on recheck by me was improved at 126/86 but initially was 139/90.  He has been on lisinopril 20 mg and HCTZ 12.5 mg for hypertension.  He has mixed hyperlipidemia and is on pravastatin in addition to fenofibrate.  He has a history of anxiety and is on duloxetine.  It is certainly possible that this may be affecting his ability to achieve good REM sleep which may be contributing to his poor sleep.  I discussed with him the importance of treating his sleep apnea and the adverse consequences relative to his cardiovascular risk if left untreated including its contribution to hypertension with often lack of the normal diastolic dip associated with normal sleep, potential for nocturnal arrhythmias particularly in the setting of significant oxygen desaturation, glucose intolerance, GERD, as well as potential total hypoxemia mediated ischemia both cardiac and cerebrovascular.  I will schedule him to undergo a split-night protocol in lab for further evaluation of his sleep apnea.  I suspect his significant nocturia may be related to his sleep apnea.  He will follow-up with his primary physician and Dr. Percival Spanish for his cardiology care.  Of note, he apparently had had laboratory in September which showed elevation of liver function.  If LFTs remain elevated, statin therapy should be held.  He has significant mixed hyperlipidemia with markedly elevated triglycerides despite taking fenofibrate and may  benefit from Vascepa or Lovaza. I suspect he may have a component of hepatic steatosis and it may be worthwhile to consider ultrasound imaging.   Medication Adjustments/Labs and Tests Ordered: Current medicines are reviewed at length with the patient today.  Concerns regarding medicines are outlined above.  Medication changes, Labs and Tests ordered today are listed in the Patient Instructions below. Patient Instructions  Medication Instructions:  The current medical regimen is effective;  continue present plan and medications as directed. Please refer to the Current Medication list given to you today. If you need a refill on your cardiac medications before your next appointment, please call your pharmacy.  Testing/Procedures: Your physician has recommended that you have a sleep study. This test records several body functions during sleep, including: brain activity, eye movement, oxygen and carbon dioxide blood levels, heart rate and rhythm, breathing rate and rhythm, the flow of air through your mouth and nose, snoring, body muscle movements, and chest and belly movement.  Follow-Up: IN 3-4 months-FOR SLEEP DISCUSSION In Person You may see Shelva Majestic or one of the following Advanced Practice Providers on your designated Care Team:  Rosaria Ferries, PA-C Jory Sims, DNP, ANP Cadence Kathlen Mody, NP  .     At [facility], you and your health needs are our  priority.  As part of our continuing mission to provide you with exceptional heart care, we have created designated Provider Care Teams.  These Care Teams include your primary Cardiologist (physician) and Advanced Practice Providers (APPs -  Physician Assistants and Nurse Practitioners) who all work together to provide you with the care you need, when you need it.  Thank you for choosing [facility] at [facility]!!          Signed, Shelva Majestic, MD  07/19/2019 [facility] [address] [place], [place], [place]  [address] Phone: [phone]

## 2019-07-21 ENCOUNTER — Telehealth: Payer: Self-pay | Admitting: *Deleted

## 2019-07-21 NOTE — Telephone Encounter (Signed)
PA for sleep study faxed to [facility]. Pending case [id number].

## 2019-07-25 NOTE — Addendum Note (Signed)
Addended by: Waylan Rocher on: 07/25/2019 05:31 PM   Modules accepted: Orders

## 2019-07-26 ENCOUNTER — Emergency Department (HOSPITAL_COMMUNITY): Payer: Federal, State, Local not specified - PPO

## 2019-07-26 ENCOUNTER — Other Ambulatory Visit: Payer: Self-pay

## 2019-07-26 ENCOUNTER — Emergency Department (HOSPITAL_COMMUNITY)
Admission: EM | Admit: 2019-07-26 | Discharge: 2019-07-26 | Disposition: A | Discharge status: Home / Self Care | Payer: Federal, State, Local not specified - PPO | Attending: Emergency Medicine | Admitting: Emergency Medicine

## 2019-07-26 ENCOUNTER — Encounter (HOSPITAL_COMMUNITY): Payer: Self-pay | Admitting: Emergency Medicine

## 2019-07-26 DIAGNOSIS — I10 Essential (primary) hypertension: Secondary | ICD-10-CM | POA: Insufficient documentation

## 2019-07-26 DIAGNOSIS — U071 COVID-19: Secondary | ICD-10-CM

## 2019-07-26 DIAGNOSIS — R509 Fever, unspecified: Secondary | ICD-10-CM | POA: Diagnosis not present

## 2019-07-26 DIAGNOSIS — Z79899 Other long term (current) drug therapy: Secondary | ICD-10-CM | POA: Diagnosis not present

## 2019-07-26 NOTE — ED Provider Notes (Signed)
[facility] DEPT Provider Note   CSN: [id number] Arrival date & time: 07/26/19  1032     History   Chief Complaint Chief Complaint  Patient presents with  . COVID    HPI TORAN WIMPY is a 53 y.o. male.     Patient known positive for coronavirus.  Has had and have been worsening cough.  Want to be evaluated.  Has not had any hypoxic events at home as he has been checking.  Overall he is asymptomatic.  His mother is also here being evaluated and he decided to check in as well.  Overall he says he has been doing well.  No chest pain.  No extreme shortness of breath.  The history is provided by the patient.  URI Presenting symptoms: cough   Presenting symptoms: no ear pain, no fever and no sore throat   Severity:  Mild Onset quality:  Gradual Chronicity:  New Relieved by:  Nothing Worsened by:  Nothing Associated symptoms: no arthralgias, no headaches, no myalgias, no neck pain, no sinus pain, no sneezing, no swollen glands and no wheezing     Past Medical History:  Diagnosis Date  . Hypertension   . Sleep apnea    Does not use CPAP    Patient Active Problem List   Diagnosis Date Noted  . MDD (major depressive disorder) 06/08/2019    Past Surgical History:  Procedure Laterality Date  . TMJ ARTHROSCOPY          Home Medications    Prior to Admission medications   Medication Sig Start Date End Date Taking? Authorizing Provider  DULoxetine (CYMBALTA) 30 MG capsule Take 1 capsule (30 mg total) by mouth daily. Patient not taking: Reported on 07/17/2019 06/12/19   Mallie Darting, NP  fenofibrate 160 MG tablet Take 1 tablet (160 mg total) by mouth daily. Patient not taking: Reported on 07/17/2019 06/12/19   Mallie Darting, NP  fluticasone [facility]) 50 MCG/ACT nasal spray Place 1 spray into both nostrils daily as needed for allergies or rhinitis.    [provider]  hydrochlorothiazide (MICROZIDE) 12.5 MG capsule Take 1  capsule (12.5 mg total) by mouth daily. 06/12/19   Mallie Darting, NP  lisinopril (ZESTRIL) 20 MG tablet Take 1 tablet (20 mg total) by mouth daily. Patient not taking: Reported on 07/17/2019 06/12/19   Mallie Darting, NP  pantoprazole (PROTONIX) 40 MG tablet Take 1 tablet (40 mg total) by mouth daily. Patient not taking: Reported on 07/17/2019 06/12/19   Mallie Darting, NP  pravastatin (PRAVACHOL) 40 MG tablet TAKE 1 TABLET(40 MG) BY MOUTH EVERY EVENING 06/27/19   Minus Breeding, MD    Family History Family History  Problem Relation Age of Onset  . Cancer Mother        Lung cancer and colon cancer  . COPD Mother   . Heart disease Father        Died suddenly age 23.      Social History Social History   Tobacco Use  . Smoking status: Never Smoker  . Smokeless tobacco: Never Used  Substance Use Topics  . Alcohol use: Yes    Comment: Pt report occasianly drinking 1/2 beers per month  . Drug use: No     Allergies   Bee venom   Review of Systems Review of Systems  Constitutional: Negative for chills and fever.  HENT: Negative for ear pain, sinus pain, sneezing and sore throat.   Eyes: Negative  for pain and visual disturbance.  Respiratory: Positive for cough and shortness of breath. Negative for wheezing.   Cardiovascular: Negative for chest pain and palpitations.  Gastrointestinal: Negative for abdominal pain and vomiting.  Genitourinary: Negative for dysuria and hematuria.  Musculoskeletal: Negative for arthralgias, back pain, myalgias and neck pain.  Skin: Negative for color change and rash.  Neurological: Negative for seizures, syncope and headaches.  All other systems reviewed and are negative.    Physical Exam Updated Vital Signs  ED Triage Vitals [07/26/19 1100]  Enc Vitals Group     BP (!) 135/103     Pulse Rate 97     Resp 16     Temp 98.2 F (36.8 C)     Temp Source Oral     SpO2 97 %     Weight      Height      Head Circumference      Peak Flow       Pain Score 4     Pain Loc      Pain Edu?      Excl. in [place]?     Physical Exam Vitals signs and nursing note reviewed.  Constitutional:      General: He is not in acute distress.    Appearance: He is well-developed. He is not ill-appearing.  HENT:     Head: Normocephalic and atraumatic.     Nose: Nose normal.     Mouth/Throat:     Mouth: Mucous membranes are moist.  Eyes:     Extraocular Movements: Extraocular movements intact.     Conjunctiva/sclera: Conjunctivae normal.     Pupils: Pupils are equal, round, and reactive to light.  Neck:     Musculoskeletal: Normal range of motion and neck supple.  Cardiovascular:     Rate and Rhythm: Normal rate and regular rhythm.     Pulses: Normal pulses.     Heart sounds: Normal heart sounds. No murmur.  Pulmonary:     Effort: Pulmonary effort is normal. No respiratory distress.     Breath sounds: Normal breath sounds.  Abdominal:     General: Abdomen is flat.     Palpations: Abdomen is soft.     Tenderness: There is no abdominal tenderness.  Skin:    General: Skin is warm and dry.     Capillary Refill: Capillary refill takes less than 2 seconds.  Neurological:     General: No focal deficit present.     Mental Status: He is alert.  Psychiatric:        Mood and Affect: Mood normal.      ED Treatments / Results  Labs (all labs ordered are listed, but only abnormal results are displayed) Labs Reviewed - No data to display  EKG None  Radiology Dg Chest Portable 1 View  Result Date: 07/26/2019 CLINICAL DATA:  Body aches and fever EXAM: PORTABLE CHEST 1 VIEW COMPARISON:  None. FINDINGS: The heart size and mediastinal contours are within normal limits. Both lungs are clear. The visualized skeletal structures are unremarkable. IMPRESSION: Negative chest. Electronically Signed   By: Monte Fantasia M.D.   On: 07/26/2019 11:26    Procedures Procedures (including critical care time)  Medications Ordered in ED Medications -  No data to display   Initial Impression / Assessment and Plan / ED Course  I have reviewed the triage vital signs and the nursing notes.  Pertinent labs & imaging results that were available during my care of the  patient were reviewed by me and considered in my medical decision making (see chart for details).     LC OBLANDER is a 53 year old male with history of hypertension who presents to the ED with cough.  Normal vitals.  Normal work of breathing.  No hypoxia.  Positive for coronavirus about 7 days ago.  Overall he is asymptomatic.  Wanted to be evaluated because he was having some cough and shortness of breath.  Chest x-ray showed no acute process.  No pneumonia, no inflammation, no effusion, no pneumothorax.  Has clear breath sounds on exam.  Overall patient has mild course of infection at this time.  Given return precautions.  Does have pulse ox at home.  Discharged in good condition.  Recommend continued self-isolation at home.  This chart was dictated using voice recognition software.  Despite best efforts to proofread,  errors can occur which can change the documentation meaning.    Final Clinical Impressions(s) / ED Diagnoses   Final diagnoses:  [id number]    ED Discharge Orders    None       Lennice Sites, DO 07/26/19 1205

## 2019-07-26 NOTE — ED Triage Notes (Signed)
Pt requesting "lab and a xray" related to testing positive for COVID on the 25th. Pt verbalizes experiencing chills, generalized body aches, fever, cough, and n/v/d. Denies SOB or CP.   Pt treated fever at 0800 with tylenol; afebrile with triage.  Pt ambulatory from triage with steady gait; does not appear to be in distress. No signs of respiratory issues upon observation.

## 2019-07-28 ENCOUNTER — Other Ambulatory Visit: Payer: Self-pay

## 2019-07-28 DIAGNOSIS — R7989 Other specified abnormal findings of blood chemistry: Secondary | ICD-10-CM

## 2019-07-28 DIAGNOSIS — Z79899 Other long term (current) drug therapy: Secondary | ICD-10-CM

## 2019-07-28 DIAGNOSIS — E782 Mixed hyperlipidemia: Secondary | ICD-10-CM

## 2019-08-10 ENCOUNTER — Telehealth: Payer: Self-pay | Admitting: *Deleted

## 2019-08-10 NOTE — Telephone Encounter (Signed)
Patient notified of sleep study and COVID appointment details. Understands he needs to quarantine after covid test.

## 2019-08-26 ENCOUNTER — Inpatient Hospital Stay (HOSPITAL_COMMUNITY)
Admission: RE | Admit: 2019-08-26 | Discharge: 2019-08-26 | Disposition: A | Discharge status: Home / Self Care | Payer: Federal, State, Local not specified - PPO | Source: Ambulatory Visit

## 2019-08-26 NOTE — Progress Notes (Signed)
Pt not retested for covid today, due to testing positive at [facility] on 07/21/2019. Pt able to provide proof of result. Based on current criteria, the pt is not viral, but can still test positive for 3-4 months after.

## 2019-08-28 ENCOUNTER — Ambulatory Visit (HOSPITAL_BASED_OUTPATIENT_CLINIC_OR_DEPARTMENT_OTHER): Payer: Federal, State, Local not specified - PPO | Admitting: Cardiovascular Disease

## 2019-08-29 ENCOUNTER — Telehealth: Payer: Self-pay | Admitting: Cardiovascular Disease

## 2019-08-29 NOTE — Telephone Encounter (Signed)
New message:     Patient calling concering his appt. Patient would like for you to call him concering this.

## 2019-09-05 DIAGNOSIS — Z79899 Other long term (current) drug therapy: Secondary | ICD-10-CM | POA: Diagnosis not present

## 2019-09-05 DIAGNOSIS — R7989 Other specified abnormal findings of blood chemistry: Secondary | ICD-10-CM | POA: Diagnosis not present

## 2019-09-05 DIAGNOSIS — E782 Mixed hyperlipidemia: Secondary | ICD-10-CM | POA: Diagnosis not present

## 2019-09-05 LAB — BASIC METABOLIC PANEL
BUN/Creatinine Ratio: 19 (ref 9–20)
BUN: 13 mg/dL (ref 6–24)
CO2: 22 mmol/L (ref 20–29)
Calcium: 9.8 mg/dL (ref 8.7–10.2)
Chloride: 99 mmol/L (ref 96–106)
Creatinine, Ser: 0.68 mg/dL — ABNORMAL LOW (ref 0.76–1.27)
GFR calc Af Amer: 124 mL/min/{1.73_m2} (ref >59)
GFR calc non Af Amer: 108 mL/min/{1.73_m2} (ref >59)
Glucose: 91 mg/dL (ref 65–99)
Potassium: 4.3 mmol/L (ref 3.5–5.2)
Sodium: 136 mmol/L (ref 134–144)

## 2019-09-05 LAB — LIPID PANEL
Chol/HDL Ratio: 4.4 ratio (ref 0.0–5.0)
Cholesterol, Total: 185 mg/dL (ref 100–199)
HDL: 42 mg/dL (ref >39)
LDL Chol Calc (NIH): 98 mg/dL (ref 0–99)
Triglycerides: 268 mg/dL — ABNORMAL HIGH (ref 0–149)
VLDL Cholesterol Cal: 45 mg/dL — ABNORMAL HIGH (ref 5–40)

## 2019-09-13 NOTE — Progress Notes (Signed)
Virtual Visit via Video Note   This visit type was conducted due to national recommendations for restrictions regarding the COVID-19 Pandemic (e.g. social distancing) in an effort to limit this patient's exposure and mitigate transmission in our community.  Due to his co-morbid illnesses, this patient is at least at moderate risk for complications without adequate follow up.  This format is felt to be most appropriate for this patient at this time.  All issues noted in this document were discussed and addressed.  A limited physical exam was performed with this format.  Please refer to the patient's chart for his consent to telehealth for [facility].   Date:  09/14/2019   ID:  Anthony Golden, DOB [birth year], MRN [id number]  Patient Location: Home Provider Location: Home  PCP:  Donald Prose, MD  Cardiologist:  Minus Breeding, MD  Electrophysiologist:  None   Evaluation Performed:  Follow-Up Visit  Chief Complaint:  Fatigue  History of Present Illness:    Anthony Golden is a 53 y.o. male who was referred by Donald Prose, MD for evaluation of chest pain.   When I saw him he had an elevated coronary calcium and he had a negative POET (Plain Old Exercise Treadmill).  He returns for follow-up.    Since I last saw him he is done okay.  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  He denies any chest pressure, neck or arm discomfort.  He has no palpitations, presyncope or syncope.  He has joint pains.  He does not sleep well.  He had Covid in sleep study was canceled.  He has been very fatigued and falling asleep easily.  He is not been having any new shortness of breath, PND or orthopnea.  He has lost about 5 or 6 pounds.  He met somebody who is making him change some of his diet and this is reflected on his lipids below.   The patient does not have symptoms concerning for COVID-19 infection (fever, chills, cough, or new shortness of breath).    Past Medical History:  Diagnosis Date  . Hypertension   . Sleep  apnea    Does not use CPAP   Past Surgical History:  Procedure Laterality Date  . TMJ ARTHROSCOPY       Current Meds  Medication Sig  . fluticasone (FLONASE) 50 MCG/ACT nasal spray Place 1 spray into both nostrils daily as needed for allergies or rhinitis.  . hydrochlorothiazide (MICROZIDE) 12.5 MG capsule Take 1 capsule (12.5 mg total) by mouth daily.  Marland Kitchen lisinopril (ZESTRIL) 20 MG tablet Take 1 tablet (20 mg total) by mouth daily.  . pantoprazole (PROTONIX) 40 MG tablet Take 1 tablet (40 mg total) by mouth daily.  . pravastatin (PRAVACHOL) 40 MG tablet TAKE 1 TABLET(40 MG) BY MOUTH EVERY EVENING     Allergies:   Bee venom   Social History   Tobacco Use  . Smoking status: Never Smoker  . Smokeless tobacco: Never Used  Substance Use Topics  . Alcohol use: Yes    Comment: Pt report occasianly drinking 1/2 beers per month  . Drug use: No     Family Hx: The patient's family history includes COPD in his mother; Cancer in his mother; Heart disease in his father.  ROS:   Please see the history of present illness.     All other systems reviewed and are negative.   Prior CV studies:   The following studies were reviewed today:  Labs  Labs/Other Tests and Data  Reviewed:    EKG:  No ECG reviewed.  Recent Labs: 06/08/2019: B Natriuretic Peptide 31.4; Hemoglobin 15.8; Platelets 269; TSH 3.599 06/10/2019: ALT 104 09/05/2019: BUN 13; Creatinine, Ser 0.68; Potassium 4.3; Sodium 136   Recent Lipid Panel Lab Results  Component Value Date/Time   CHOL 185 09/05/2019 08:57 AM   TRIG 268 (H) 09/05/2019 08:57 AM   HDL 42 09/05/2019 08:57 AM   CHOLHDL 4.4 09/05/2019 08:57 AM   CHOLHDL 5.4 06/08/2019 06:37 PM   LDLCALC 98 09/05/2019 08:57 AM   LDLDIRECT 30.0 06/08/2019 06:37 PM    Wt Readings from Last 3 Encounters:  07/17/19 241 lb 6.4 oz (109.5 kg)  08/18/18 243 lb 3.2 oz (110.3 kg)  05/10/18 213 lb 12.8 oz (97 kg)     Objective:    Vital Signs:  BP (!) 134/96     VITAL SIGNS:  reviewed GEN:  no acute distress NEURO:  alert and oriented x 3, no obvious focal deficit PSYCH:  normal affect  ASSESSMENT & PLAN:    CHEST PAIN:   Is not having any chest discomfort.  He had a negative stress test with an excellent exercise capacity.  No further testing is indicated and he will continue with risk reduction.  HTN:  The blood pressure is slightly elevated.  However, he does not want to increase his medicines at this point because he thinks it is stress and pain and I think is reasonable for him to watch his diastolics and let me know if they are still running high after he continues to lose weight and after the holidays are over.  He also needs management of his sleep apnea.  ELEVATED CORONARY CALCIUM:    We are again managing this with risk reduction.   DYSLIPIDEMIA:    His triglycerides 3 months ago was 746 and with diet changes down to 268.  HDL is up to 45.  LDL slightly above target but I would suggest repeating this in about 6 months with continued dietary changes and remaining on the meds as listed.   SLEEP STUDY: I sent a message to our staff to arrange a sleep study perhaps an in-home sleep study.  COVID-19 Education: The signs and symptoms of COVID-19 were discussed with the patient and how to seek care for testing (follow up with PCP or arrange E-visit).  He is already had Covid   Time:   Today, I have spent 16 minutes with the patient with telehealth technology discussing the above problems.     Medication Adjustments/Labs and Tests Ordered: Current medicines are reviewed at length with the patient today.  Concerns regarding medicines are outlined above.   Tests Ordered: No orders of the defined types were placed in this encounter.   Medication Changes: No orders of the defined types were placed in this encounter.   Follow Up:  In Person in one year  Signed, Minus Breeding, MD  09/14/2019 11:34 AM    [place]

## 2019-09-14 ENCOUNTER — Telehealth: Payer: Self-pay

## 2019-09-14 ENCOUNTER — Encounter: Payer: Self-pay | Admitting: Cardiology

## 2019-09-14 ENCOUNTER — Telehealth (INDEPENDENT_AMBULATORY_CARE_PROVIDER_SITE_OTHER): Payer: Federal, State, Local not specified - PPO | Admitting: Cardiology

## 2019-09-14 VITALS — BP 134/96

## 2019-09-14 DIAGNOSIS — R931 Abnormal findings on diagnostic imaging of heart and coronary circulation: Secondary | ICD-10-CM | POA: Diagnosis not present

## 2019-09-14 DIAGNOSIS — E785 Hyperlipidemia, unspecified: Secondary | ICD-10-CM | POA: Diagnosis not present

## 2019-09-14 DIAGNOSIS — R079 Chest pain, unspecified: Secondary | ICD-10-CM | POA: Diagnosis not present

## 2019-09-14 DIAGNOSIS — I10 Essential (primary) hypertension: Secondary | ICD-10-CM | POA: Diagnosis not present

## 2019-09-14 DIAGNOSIS — Z7189 Other specified counseling: Secondary | ICD-10-CM

## 2019-09-14 NOTE — Telephone Encounter (Signed)
DPR on file. Left detailed message of 12/17 AVS instructions. Stated AVS available on mychart

## 2019-09-14 NOTE — Patient Instructions (Signed)
Medication Instructions:  Your physician recommends that you continue on your current medications as directed. Please refer to the Current Medication list given to you today.  *If you need a refill on your cardiac medications before your next appointment, please call your pharmacy*  Lab Work: none If you have labs (blood work) drawn today and your tests are completely normal, you will receive your results only by: Marland Kitchen MyChart Message (if you have MyChart) OR . A paper copy in the mail If you have any lab test that is abnormal or we need to change your treatment, we will call you to review the results.  Testing/Procedures: none  Follow-Up: At [facility], you and your health needs are our priority.  As part of our continuing mission to provide you with exceptional heart care, we have created designated Provider Care Teams.  These Care Teams include your primary Cardiologist (physician) and Advanced Practice Providers (APPs -  Physician Assistants and Nurse Practitioners) who all work together to provide you with the care you need, when you need it.  Your next appointment:   12 month(s)  The format for your next appointment:   Either In Person or Virtual  Provider:   Minus Breeding, MD

## 2019-09-18 ENCOUNTER — Other Ambulatory Visit: Payer: Self-pay

## 2019-09-18 ENCOUNTER — Ambulatory Visit (HOSPITAL_BASED_OUTPATIENT_CLINIC_OR_DEPARTMENT_OTHER): Payer: Federal, State, Local not specified - PPO | Attending: Cardiovascular Disease | Admitting: Cardiovascular Disease

## 2019-09-18 DIAGNOSIS — Z79899 Other long term (current) drug therapy: Secondary | ICD-10-CM | POA: Diagnosis not present

## 2019-09-18 DIAGNOSIS — G4734 Idiopathic sleep related nonobstructive alveolar hypoventilation: Secondary | ICD-10-CM | POA: Diagnosis not present

## 2019-09-18 DIAGNOSIS — R0902 Hypoxemia: Secondary | ICD-10-CM | POA: Insufficient documentation

## 2019-09-18 DIAGNOSIS — I10 Essential (primary) hypertension: Secondary | ICD-10-CM | POA: Diagnosis not present

## 2019-09-18 DIAGNOSIS — Z7951 Long term (current) use of inhaled steroids: Secondary | ICD-10-CM | POA: Diagnosis not present

## 2019-09-18 DIAGNOSIS — G4730 Sleep apnea, unspecified: Secondary | ICD-10-CM | POA: Diagnosis not present

## 2019-09-18 DIAGNOSIS — G4733 Obstructive sleep apnea (adult) (pediatric): Secondary | ICD-10-CM

## 2019-09-19 ENCOUNTER — Other Ambulatory Visit (HOSPITAL_BASED_OUTPATIENT_CLINIC_OR_DEPARTMENT_OTHER): Payer: Self-pay

## 2019-09-19 DIAGNOSIS — I10 Essential (primary) hypertension: Secondary | ICD-10-CM

## 2019-09-19 DIAGNOSIS — G4730 Sleep apnea, unspecified: Secondary | ICD-10-CM

## 2019-09-23 ENCOUNTER — Encounter (HOSPITAL_BASED_OUTPATIENT_CLINIC_OR_DEPARTMENT_OTHER): Payer: Self-pay | Admitting: Cardiovascular Disease

## 2019-09-23 NOTE — Procedures (Signed)
Patient Name: Anthony Golden, Anthony Golden Date: 09/18/2019 Gender: Male D.O.B: December 24, 1965 Age (years): 1 Referring Provider: Shelva Majestic MD, ABSM Height (inches): 70 Interpreting Physician: Shelva Majestic MD, ABSM Weight (lbs): 234 RPSGT: Zadie Rhine BMI: 34 MRN: [id number] Neck Size: 19.00  CLINICAL INFORMATION Sleep Study Type: Split Night CPAP  Indication for sleep study: Severe OSA in 2010 used CPAP for 2 years  Epworth Sleepiness Score: 9  SLEEP STUDY TECHNIQUE As per the AASM Manual for the Scoring of Sleep and Associated Events v2.3 (April 2016) with a hypopnea requiring 4% desaturations.  The channels recorded and monitored were frontal, central and occipital EEG, electrooculogram (EOG), submentalis EMG (chin), nasal and oral airflow, thoracic and abdominal wall motion, anterior tibialis EMG, snore microphone, electrocardiogram, and pulse oximetry. Continuous positive airway pressure (CPAP) was initiated when the patient met split night criteria and was titrated according to treat sleep-disordered breathing.  MEDICATIONS DULoxetine (CYMBALTA) 30 MG capsule  fenofibrate 160 MG tablet  fluticasone (FLONASE) 50 MCG/ACT nasal spray  hydrochlorothiazide (MICROZIDE) 12.5 MG capsule  lisinopril (ZESTRIL) 20 MG tablet  pantoprazole (PROTONIX) 40 MG tablet  pravastatin (PRAVACHOL) 40 MG tablet  Medications self-administered by patient taken the night of the study : N/A  RESPIRATORY PARAMETERS Diagnostic Total AHI (/hr): 81.4 RDI (/hr): 81.4 OA Index (/hr): 26.4 CA Index (/hr): 0.0 REM AHI (/hr): 76.0 NREM AHI (/hr): 82.1 Supine AHI (/hr): 81.4 Non-supine AHI (/hr): 0 Min O2 Sat (%): 66.0 Mean O2 (%): 84.1 Time below 88% (min): 84.1   Titration Optimal Pressure (cm): 12 AHI at Optimal Pressure (/hr): 0.0 Min O2 at Optimal Pressure (%): 89.0 Supine % at Optimal (%): 100 Sleep % at Optimal (%): 86  SLEEP ARCHITECTURE The recording time for the entire night was 355.8  minutes.  During a baseline period of 136.4 minutes, the patient slept for 131.9 minutes in REM and nonREM, yielding a sleep efficiency of 96.7%%. Sleep onset after lights out was 3.0 minutes with a REM latency of 65.0 minutes. The patient spent 1.1%% of the night in stage N1 sleep, 87.5%% in stage N2 sleep, 0.0%% in stage N3 and 11.4% in REM.  During the titration period of 214.3 minutes, the patient slept for 206.5 minutes in REM and nonREM, yielding a sleep efficiency of 96.4%%. Sleep onset after CPAP initiation was 2.5 minutes with a REM latency of 4.5 minutes. The patient spent 2.7%% of the night in stage N1 sleep, 56.2%% in stage N2 sleep, 0.2%% in stage N3 and 40.9% in REM.  CARDIAC DATA The 2 lead EKG demonstrated sinus rhythm. The mean heart rate was 100.0 beats per minute. Other EKG findings include: None.  LEG MOVEMENT DATA The total Periodic Limb Movements of Sleep (PLMS) were 0. The PLMS index was 0.0 .  IMPRESSIONS - Severe obstructive sleep apnea occurred during the diagnostic portion of the study (AHI 81.4/hour). An optimal PAP pressure was selected for this patient ( 12 cm of water) - No significant central sleep apnea occurred during the diagnostic portion of the study (CAI 0.0/hour). - Severe oxygen desaturation to a nadir of 66% during the diagnostic portion of the study. - No snoring was audible during the diagnostic portion of the study. - No cardiac abnormalities were noted during this study. - Clinically significant periodic limb movements did not occur during sleep.  DIAGNOSIS - Obstructive Sleep Apnea (327.23 [G47.33 ICD-10]) - Nocturnal hypoxia  RECOMMENDATIONS - Recommend an initial trial of CPAP therapy with EPR of 3 at 12 cm  H2O with heated humidification.  A Medium size Philips Respironics Nasal Pillow Mask Nuance Pro Gel mask was used for the titration. - Effort should be made to optimize nasal and oropharynhgeal patency. - Avoid alcohol, sedatives and other  CNS depressants that may worsen sleep apnea and disrupt normal sleep architecture. - Sleep hygiene should be reviewed to assess factors that may improve sleep quality. - Weight management and regular exercise should be initiated or continued. - Recommend a download after 30 days and sleep clinic evaluation after 4 weeks of therapy.  [Electronically signed] 09/23/2019 01:02 PM  Shelva Majestic MD, [facility], [place], American Board of Sleep Medicine   NPI: [id number] [place] PH: [phone]   FX: [phone] [place]

## 2019-10-02 ENCOUNTER — Telehealth: Payer: Self-pay | Admitting: Cardiovascular Disease

## 2019-10-02 NOTE — Telephone Encounter (Signed)
Did not need this encounter °

## 2019-10-03 ENCOUNTER — Telehealth: Payer: Self-pay | Admitting: *Deleted

## 2019-10-03 NOTE — Telephone Encounter (Signed)
Patient called in and was given sleep study results and recommendations. Orders for CPAP set up sent to [facility].

## 2019-10-12 DIAGNOSIS — G4733 Obstructive sleep apnea (adult) (pediatric): Secondary | ICD-10-CM | POA: Diagnosis not present

## 2019-10-25 ENCOUNTER — Ambulatory Visit: Payer: Federal, State, Local not specified - PPO | Admitting: Cardiovascular Disease

## 2019-10-25 ENCOUNTER — Other Ambulatory Visit: Payer: Self-pay

## 2019-10-25 ENCOUNTER — Encounter: Payer: Self-pay | Admitting: Cardiovascular Disease

## 2019-10-25 VITALS — BP 125/88 | HR 94 | Temp 97.2°F | Ht 70.0 in | Wt 247.4 lb

## 2019-10-25 DIAGNOSIS — I10 Essential (primary) hypertension: Secondary | ICD-10-CM | POA: Diagnosis not present

## 2019-10-25 DIAGNOSIS — G4733 Obstructive sleep apnea (adult) (pediatric): Secondary | ICD-10-CM

## 2019-10-25 DIAGNOSIS — E669 Obesity, unspecified: Secondary | ICD-10-CM

## 2019-10-25 DIAGNOSIS — R079 Chest pain, unspecified: Secondary | ICD-10-CM | POA: Diagnosis not present

## 2019-10-25 DIAGNOSIS — R931 Abnormal findings on diagnostic imaging of heart and coronary circulation: Secondary | ICD-10-CM

## 2019-10-25 NOTE — Patient Instructions (Signed)
Medication Instructions:  Continue current medications  *If you need a refill on your cardiac medications before your next appointment, please call your pharmacy*  Lab Work: None Ordered  Testing/Procedures: None Ordered  Follow-Up: At [facility], you and your health needs are our priority.  As part of our continuing mission to provide you with exceptional heart care, we have created designated Provider Care Teams.  These Care Teams include your primary Cardiologist (physician) and Advanced Practice Providers (APPs -  Physician Assistants and Nurse Practitioners) who all work together to provide you with the care you need, when you need it.  Your next appointment:   1 year(s)  The format for your next appointment:   In Person  Provider:   Mahmood Kelly, MD    

## 2019-10-25 NOTE — Progress Notes (Signed)
Cardiology Office Note    Date:  10/27/2019   ID:  Anthony Golden, DOB 01-15-1966, MRN [id number]  PCP:  Donald Prose, MD  Cardiologist:  Shelva Majestic, MD (sleep); Dr. 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 Percival Spanish for primary cardiology care.  He was recently found to have significant sleep apnea and has initiated CPAP therapy.  He presents for initial sleep evaluation.  Anthony Golden is originally from [place], [place].  He has a history of previously documented sleep apnea  reportedly over 10 years ago had a sleep study and was on CPAP therapy.  He apparently stopped using therapy and has not been on treatment for over 10 years.  Following a divorce, he moved  from [place] to [facility].  He had developed a worsening cough leading to an ER evaluation on July 26, 2019 for suspected Covid.  At that time he did have some shortness of breath.  A chest x-ray did not show any acute process.  He was felt to have a mild course of infection and was not hospitalized with self-isolation at home.  He has seen Dr. Percival Spanish for a chest pain and had undergone a calcium score which was increased at 51, representing 49 percentile for age and sex matched control.  With evidence for calcium in the proximal and mid LAD and distal RCA with significant mitral annular calcification.  He subsequently had a routine treadmill test.   Due to recent progressive symptoms of sleep apnea manifested as fatigue, daytime sleepiness, nonrestorative sleep, and snoring, he was referred for a sleep study which was done on August 29, 2019.  This revealed severe obstructive sleep apnea in a split-night protocol with overall AHI 81.4/h.  He had severe oxygen desaturation to a nadir of 66% during the diagnostic portion of the study.  CPAP was initiated and was titrated up to 12 cm water pressure with excellent benefit.  He  has subsequently been on CPAP therapy since his set up date on October 12, 2019.  Since initiating therapy, he has been 100% compliant and has been averaging 5 hours and 15 minutes of CPAP use per night.  AHI is 0.8.  He has been using a Respironics nasal pro gel medium size mask but was recently switched to a ResMed N 20 large mask which he prefers.  Since initiating CPAP therapy, he has noticed dramatic benefit and is now sleeping significantly better.  His nocturia has significantly improved.  His sleep is restorative.  He denies any residual daytime sleepiness.  An Epworth Sleepiness Scale score was calculated in the office today and this endorsed at 3 as shown below:  Epworth Sleepiness Scale: Situation   Chance of Dozing/Sleeping (0 = never , 1 = slight chance , 2 = moderate chance , 3 = high chance )   sitting and reading 0   watching TV 1   sitting inactive in a public place 0   being a passenger in a motor vehicle for an hour or more 1   lying down in the afternoon 1   sitting and talking to someone 0   sitting quietly after lunch (no alcohol) 0   while stopped for a few minutes in traffic as the driver 0   Total Score  3   Presently, he feels significantly improved with treatment.  He denies any  painful restless legs, bruxism, hypnagogic hallucinations or cataplectic events.  He presents for his initial sleep evaluation.  Past Medical History:  Diagnosis Date  . Hypertension   . Sleep apnea    Does not use CPAP    Past Surgical History:  Procedure Laterality Date  . TMJ ARTHROSCOPY      Current Medications: Outpatient Medications Prior to Visit  Medication Sig Dispense Refill  . DULoxetine (CYMBALTA) 30 MG capsule Take 1 capsule (30 mg total) by mouth daily. 30 capsule 0  . fenofibrate 160 MG tablet Take 1 tablet (160 mg total) by mouth daily. 30 tablet 0  . fluticasone (FLONASE) 50 MCG/ACT nasal spray Place 1 spray into both nostrils daily as needed for allergies or  rhinitis.    Marland Kitchen lisinopril-hydrochlorothiazide (ZESTORETIC) 20-12.5 MG tablet Take 1 tablet by mouth daily.    . pantoprazole (PROTONIX) 40 MG tablet Take 1 tablet (40 mg total) by mouth daily. 30 tablet 0  . pravastatin (PRAVACHOL) 40 MG tablet TAKE 1 TABLET(40 MG) BY MOUTH EVERY EVENING 90 tablet 3  . hydrochlorothiazide (MICROZIDE) 12.5 MG capsule Take 1 capsule (12.5 mg total) by mouth daily. 30 capsule 0  . lisinopril (ZESTRIL) 20 MG tablet Take 1 tablet (20 mg total) by mouth daily. 30 tablet 0   No facility-administered medications prior to visit.     Allergies:   Bee venom   Social History   Socioeconomic History  . Marital status: Single    Spouse name: Not on file  . Number of children: Not on file  . Years of education: Not on file  . Highest education level: Not on file  Occupational History  . Not on file  Tobacco Use  . Smoking status: Never Smoker  . Smokeless tobacco: Never Used  Substance and Sexual Activity  . Alcohol use: Yes    Comment: Pt report occasianly drinking 1/2 beers per month  . Drug use: No  . Sexual activity: Not Currently  Other Topics Concern  . Not on file  Social History Narrative  . Not on file   Social Determinants of Health   Financial Resource Strain:   . Difficulty of Paying Living Expenses: Not on file  Food Insecurity:   . Worried About Charity fundraiser in the Last Year: Not on file  . Ran Out of Food in the Last Year: Not on file  Transportation Needs:   . Lack of Transportation (Medical): Not on file  . Lack of Transportation (Non-Medical): Not on file  Physical Activity:   . Days of Exercise per Week: Not on file  . Minutes of Exercise per Session: Not on file  Stress:   . Feeling of Stress : Not on file  Social Connections:   . Frequency of Communication with Friends and Family: Not on file  . Frequency of Social Gatherings with Friends and Family: Not on file  . Attends Religious Services: Not on file  . Active  Member of Clubs or Organizations: Not on file  . Attends Archivist Meetings: Not on file  . Marital Status: Not on file     Social history is notable that he is divorced.  He has no children.  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  No tobacco history.  Family History:  The patient's family history includes COPD in his mother; Cancer in his mother; Heart disease in his father.  His mother is alive at age 72.  Father died at age  68 and had congestive heart failure.  He has 4 brothers, 1 with obstructive sleep apnea.  He has 1 sister.  ROS General: Negative; No fevers, chills, or night sweats; obesity HEENT: Negative; No changes in vision or hearing, sinus congestion, difficulty swallowing Pulmonary: Negative; No cough, wheezing, shortness of breath, hemoptysis Cardiovascular: Coronary calcification on CT, history of hypertension and hyperlipidemia GI: Negative; No nausea, vomiting, diarrhea, or abdominal pain GU: Negative; No dysuria, hematuria, or difficulty voiding Musculoskeletal: Negative; no myalgias, joint pain, or weakness Hematologic/Oncology: Negative; no easy bruising, bleeding Endocrine: Negative; no heat/cold intolerance; no diabetes Neuro: Negative; no changes in balance, headaches Skin: Negative; No rashes or skin lesions Psychiatric: Negative; No behavioral problems, depression Sleep: See HPI Other comprehensive 14 point system review is negative.   PHYSICAL EXAM:   VS:  BP 125/88   Pulse 94   Temp (!) 97.2 F (36.2 C)   Ht _0  (1.778 m)   Wt 247 lb 6.4 oz (112.2 kg)   SpO2 94%   BMI 35.50 kg/m     Repeat blood pressure by me 128/84  Wt Readings from Last 3 Encounters:  10/25/19 247 lb 6.4 oz (112.2 kg)  09/18/19 234 lb (106.1 kg)  07/17/19 241 lb 6.4 oz (109.5 kg)    General: Alert, oriented, no distress.  Skin: normal turgor, no rashes, warm and dry HEENT: Normocephalic, atraumatic. Pupils equal round and reactive to light; sclera  anicteric; extraocular muscles intact;  Nose without nasal septal hypertrophy Mouth/Parynx benign; Mallinpatti scale 3 Neck: No JVD, no carotid bruits; normal carotid upstroke Lungs: clear to ausculatation and percussion; no wheezing or rales Chest wall: without tenderness to palpitation Heart: PMI not displaced, RRR, s1 s2 normal, 1/6 systolic murmur, no diastolic murmur, no rubs, gallops, thrills, or heaves Abdomen: soft, nontender; no hepatosplenomehaly, BS+; abdominal aorta nontender and not dilated by palpation. Back: no CVA tenderness Pulses 2+ Musculoskeletal: full range of motion, normal strength, no joint deformities Extremities: no clubbing cyanosis or edema, Homan's sign negative  Neurologic: grossly nonfocal; Cranial nerves grossly wnl Psychologic: Normal mood and affect   Studies/Labs Reviewed:   EKG:  EKG is ordered today.  NSR at 94; LAFB, QTc 472 msec; no ectopy  Recent Labs: BMP Latest Ref Rng & Units 09/05/2019 06/08/2019  Glucose 65 - 99 mg/dL 91 94  BUN 6 - 24 mg/dL 13 11  Creatinine 0.76 - 1.27 mg/dL 0.68(L) 0.78  BUN/Creat Ratio 9 - 20 19 -  Sodium 134 - 144 mmol/L 136 136  Potassium 3.5 - 5.2 mmol/L 4.3 3.8  Chloride 96 - 106 mmol/L 99 99  CO2 20 - 29 mmol/L 22 23  Calcium 8.7 - 10.2 mg/dL 9.8 9.0     Hepatic Function Latest Ref Rng & Units 06/10/2019 06/08/2019  Total Protein 6.5 - 8.1 g/dL 7.7 7.2  Albumin 3.5 - 5.0 g/dL 4.2 4.1  AST 15 - 41 U/L 86(H) 188(H)  ALT 0 - 44 U/L 104(H) 146(H)  Alk Phosphatase 38 - 126 U/L 59 55  Total Bilirubin 0.3 - 1.2 mg/dL 2.5(H) 1.2  Bilirubin, Direct 0.0 - 0.2 mg/dL 0.5(H) -    CBC Latest Ref Rng & Units 06/08/2019  WBC 4.0 - 10.5 K/uL 8.1  Hemoglobin 13.0 - 17.0 g/dL 15.8  Hematocrit 39.0 - 52.0 % 46.2  Platelets 150 - 400 K/uL 269   Lab Results  Component Value Date   MCV 94.7 06/08/2019   Lab Results  Component Value Date   TSH 3.599  06/08/2019   Lab Results  Component Value Date   HGBA1C 5.4  06/08/2019     BNP    Component Value Date/Time   BNP 31.4 06/08/2019 1837    ProBNP No results found for: PROBNP   Lipid Panel     Component Value Date/Time   CHOL 185 09/05/2019 0857   TRIG 268 (H) 09/05/2019 0857   HDL 42 09/05/2019 0857   CHOLHDL 4.4 09/05/2019 0857   CHOLHDL 5.4 06/08/2019 1837   VLDL UNABLE TO CALCULATE IF TRIGLYCERIDE OVER 400 mg/dL 06/08/2019 1837   LDLCALC 98 09/05/2019 0857   LDLDIRECT 30.0 06/08/2019 1837   LABVLDL 45 (H) 09/05/2019 0857     RADIOLOGY: No results found.   Additional studies/ records that were reviewed today include:  I reviewed the patient's ER Covid evaluation, evaluation of Dr. Percival Spanish, his coronary calcium CT score, stress test, as well as split-night sleep study and obtained a download since CPAP initiation and recalculated an Epworth Sleepiness Scale score.   ASSESSMENT:    1. OSA (obstructive sleep apnea)   2. Chest pain of uncertain etiology   3. Elevated coronary artery calcium score   4. Essential hypertension   5. Obesity, Class II, BMI 35-39.9      PLAN:  Anthony Golden is a 54 year old dominant who reportedly was diagnosed with sleep apnea well over 10 years ago and had used CPAP for short duration.  He had not been on therapy for over 10 years.  He has a history of hypertension, obesity, hyperlipidemia, and is status post a recent mild Covid infection for which he stayed at home following an ER evaluation.  He has been documented to have coronary calcification and coronary calcium score is increased at 232, representing 91st percentile for age and sex matched control.  A routine treadmill test was negative for ischemia.  I thoroughly reviewed his split-night sleep evaluation that was performed on September 18, 2019.  He was found to have very severe sleep apnea with an AHI of 81.4/h on the diagnostic portion of the study with severe oxygen desaturation to a nadir of 66%.  CPAP was initiated and titrated to  optimal pressure 12 cm.  He was significantly symptomatic prior to initiating CPAP therapy.  He has been CPAP therapy since October 12, 2019 and his download since initiation confirms 100% compliance with excellent AHI at 0.2/h.  Since this was my initial sleep evaluation, I had a long discussion with him today and reviewed normal sleep architecture and the effects of sleep apnea and sleep architecture disruption.  I specifically discussed his severe hypoxemia at night as a potential cause of significant nocturnal arrhythmias as well as potential coronary as well as cerebrovascular ischemia.  I discussed implications of untreated sleep apnea particularly with reference to blood pressure, atrial fibrillation, glucose intolerance, inflammation as well as GERD.  Since initiating CPAP his symptoms have dramatically improved.  He no longer is having morning headaches.  His sleep is restored.  There is no daytime sleepiness and there is resolution of prior significant snoring.  I discussed the physiology associated with significant sleep apnea related nocturia.  We discussed optimal sleep duration with CPAP therapy ideally at 8 hours per night.  He is moderately obese.  We discussed the importance of weight loss and exercise both on his cardiovascular health as well as its effects on sleep apnea.  With his coronary calcification and calcium score, he will require aggressive lifestyle adjustment with significant optimal lipid  therapy to reduce potential plaque regression and prevent plaque progression.  He will return to the cardiology care of Dr. Percival Spanish.  I will see him in 1 year for follow-up sleep evaluation or sooner as needed.  Medication Adjustments/Labs and Tests Ordered: Current medicines are reviewed at length with the patient today.  Concerns regarding medicines are outlined above.  Medication changes, Labs and Tests ordered today are listed in the Patient Instructions below. Patient Instructions    Medication Instructions:  Continue current medications  *If you need a refill on your cardiac medications before your next appointment, please call your pharmacy*  Lab Work: None Ordered  Testing/Procedures: None Ordered  Follow-Up: At [facility], you and your health needs are our priority.  As part of our continuing mission to provide you with exceptional heart care, we have created designated Provider Care Teams.  These Care Teams include your primary Cardiologist (physician) and Advanced Practice Providers (APPs -  Physician Assistants and Nurse Practitioners) who all work together to provide you with the care you need, when you need it.  Your next appointment:   1 year(s)  The format for your next appointment:   In Person  Provider:    Shelva Majestic, MD       Signed, Shelva Majestic, MD  10/27/2019 6:10 PM    [facility] [address] [place], [place], [place]  [address] Phone: [phone]

## 2019-10-30 DIAGNOSIS — J029 Acute pharyngitis, unspecified: Secondary | ICD-10-CM | POA: Diagnosis not present

## 2019-10-30 DIAGNOSIS — I10 Essential (primary) hypertension: Secondary | ICD-10-CM | POA: Diagnosis not present

## 2019-10-30 DIAGNOSIS — R21 Rash and other nonspecific skin eruption: Secondary | ICD-10-CM | POA: Diagnosis not present

## 2019-10-30 DIAGNOSIS — N529 Male erectile dysfunction, unspecified: Secondary | ICD-10-CM | POA: Diagnosis not present

## 2019-11-12 DIAGNOSIS — G4733 Obstructive sleep apnea (adult) (pediatric): Secondary | ICD-10-CM | POA: Diagnosis not present

## 2019-11-14 DIAGNOSIS — B354 Tinea corporis: Secondary | ICD-10-CM | POA: Diagnosis not present

## 2019-11-14 DIAGNOSIS — L309 Dermatitis, unspecified: Secondary | ICD-10-CM | POA: Diagnosis not present

## 2019-11-28 DIAGNOSIS — L986 Other infiltrative disorders of the skin and subcutaneous tissue: Secondary | ICD-10-CM | POA: Diagnosis not present

## 2019-11-28 DIAGNOSIS — D485 Neoplasm of uncertain behavior of skin: Secondary | ICD-10-CM | POA: Diagnosis not present

## 2019-11-28 DIAGNOSIS — L309 Dermatitis, unspecified: Secondary | ICD-10-CM | POA: Diagnosis not present

## 2019-12-06 DIAGNOSIS — Z125 Encounter for screening for malignant neoplasm of prostate: Secondary | ICD-10-CM | POA: Diagnosis not present

## 2019-12-06 DIAGNOSIS — Z0000 Encounter for general adult medical examination without abnormal findings: Secondary | ICD-10-CM | POA: Diagnosis not present

## 2019-12-06 DIAGNOSIS — I10 Essential (primary) hypertension: Secondary | ICD-10-CM | POA: Diagnosis not present

## 2019-12-07 DIAGNOSIS — L209 Atopic dermatitis, unspecified: Secondary | ICD-10-CM | POA: Diagnosis not present

## 2019-12-10 DIAGNOSIS — G4733 Obstructive sleep apnea (adult) (pediatric): Secondary | ICD-10-CM | POA: Diagnosis not present

## 2019-12-15 ENCOUNTER — Telehealth: Payer: Self-pay

## 2019-12-15 NOTE — Telephone Encounter (Signed)
Fax received from [facility] with the pt's Summary of Benefits.

## 2019-12-25 ENCOUNTER — Telehealth: Payer: Self-pay | Admitting: *Deleted

## 2019-12-25 NOTE — Telephone Encounter (Signed)
Phone to patient insurance to get Prior Authorization for [facility];  Insurance denied Dupixent : stating that EASI has to be equal to or greater than 16 for this medication to be approved: according to documentation in patient chart per Dr Denna Haggard EASI =4.  Dr. Denna Haggard notified of Denial.

## 2020-01-09 ENCOUNTER — Other Ambulatory Visit: Payer: Self-pay

## 2020-01-09 ENCOUNTER — Encounter (HOSPITAL_BASED_OUTPATIENT_CLINIC_OR_DEPARTMENT_OTHER): Payer: Federal, State, Local not specified - PPO | Admitting: Internal Medicine

## 2020-01-09 ENCOUNTER — Ambulatory Visit: Payer: Federal, State, Local not specified - PPO | Admitting: Dermatology

## 2020-01-09 ENCOUNTER — Encounter: Payer: Self-pay | Admitting: Dermatology

## 2020-01-09 DIAGNOSIS — Z1283 Encounter for screening for malignant neoplasm of skin: Secondary | ICD-10-CM

## 2020-01-09 DIAGNOSIS — L821 Other seborrheic keratosis: Secondary | ICD-10-CM | POA: Diagnosis not present

## 2020-01-09 DIAGNOSIS — L2084 Intrinsic (allergic) eczema: Secondary | ICD-10-CM | POA: Diagnosis not present

## 2020-01-09 DIAGNOSIS — D225 Melanocytic nevi of trunk: Secondary | ICD-10-CM | POA: Diagnosis not present

## 2020-01-09 DIAGNOSIS — D229 Melanocytic nevi, unspecified: Secondary | ICD-10-CM

## 2020-01-09 NOTE — Progress Notes (Addendum)
   Follow-Up Visit   Subjective  Anthony Golden is a 54 y.o. male who presents for the following: Annual Exam (no new concerns).  Eczema Location: now mostly lower torso Duration: Years Quality: Improved Associated Signs/Symptoms: Itch Modifying Factors:  Severity:  Timing: Context:   The following portions of the chart were reviewed this encounter and updated as appropriate: Tobacco  Allergies  Meds  Problems  Med Hx  Surg Hx  Fam Hx      Objective  Well appearing patient in no apparent distress; mood and affect are within normal limits.  All skin waist up examined. Legs + waist up: no atypical moles. SK left forearm. Skin tags neck and axillae.  Assessment & Plan  Intrinsic eczema (4) Left Abdomen (side) - Lower; Right Abdomen (side) - Lower; Left Lower Back; Right Lower Back  Nevus (2) Left Upper Back; Right Upper Back  May leave if clinically stable.  Seborrheic keratosis Left Forearm - Posterior  Reassure Continue topical therapy. Recheck one year, sooner prn. Skin cancer screening performed today.

## 2020-01-10 DIAGNOSIS — G4733 Obstructive sleep apnea (adult) (pediatric): Secondary | ICD-10-CM | POA: Diagnosis not present

## 2020-01-13 ENCOUNTER — Encounter: Payer: Self-pay | Admitting: Dermatology

## 2020-01-16 ENCOUNTER — Ambulatory Visit: Payer: Federal, State, Local not specified - PPO | Admitting: Dermatology

## 2020-01-16 NOTE — Telephone Encounter (Signed)
Fax received from [facility] needing additional information from [place].  Message sent through [facility] portal and I spoke with [facility] to inform her that the prescription for Dupixent needed to be cancelled because the patient isn't going to do this medication he's going to continue with topical therapy.  Per Alison Murray she will discontinue the prescription for Dupixent.

## 2020-02-07 DIAGNOSIS — M5442 Lumbago with sciatica, left side: Secondary | ICD-10-CM | POA: Diagnosis not present

## 2020-02-07 DIAGNOSIS — M5441 Lumbago with sciatica, right side: Secondary | ICD-10-CM | POA: Diagnosis not present

## 2020-02-07 DIAGNOSIS — M5117 Intervertebral disc disorders with radiculopathy, lumbosacral region: Secondary | ICD-10-CM | POA: Diagnosis not present

## 2020-02-07 DIAGNOSIS — M4696 Unspecified inflammatory spondylopathy, lumbar region: Secondary | ICD-10-CM | POA: Diagnosis not present

## 2020-02-07 DIAGNOSIS — M4306 Spondylolysis, lumbar region: Secondary | ICD-10-CM | POA: Diagnosis not present

## 2020-02-09 DIAGNOSIS — G4733 Obstructive sleep apnea (adult) (pediatric): Secondary | ICD-10-CM | POA: Diagnosis not present

## 2020-02-13 IMAGING — DX DG CHEST 1V PORT
1 series · 1 of 1 positions shown · non-contrast
Comparison: None.

CLINICAL DATA: Body aches and fever

EXAM:
PORTABLE CHEST 1 VIEW

[chest ap]
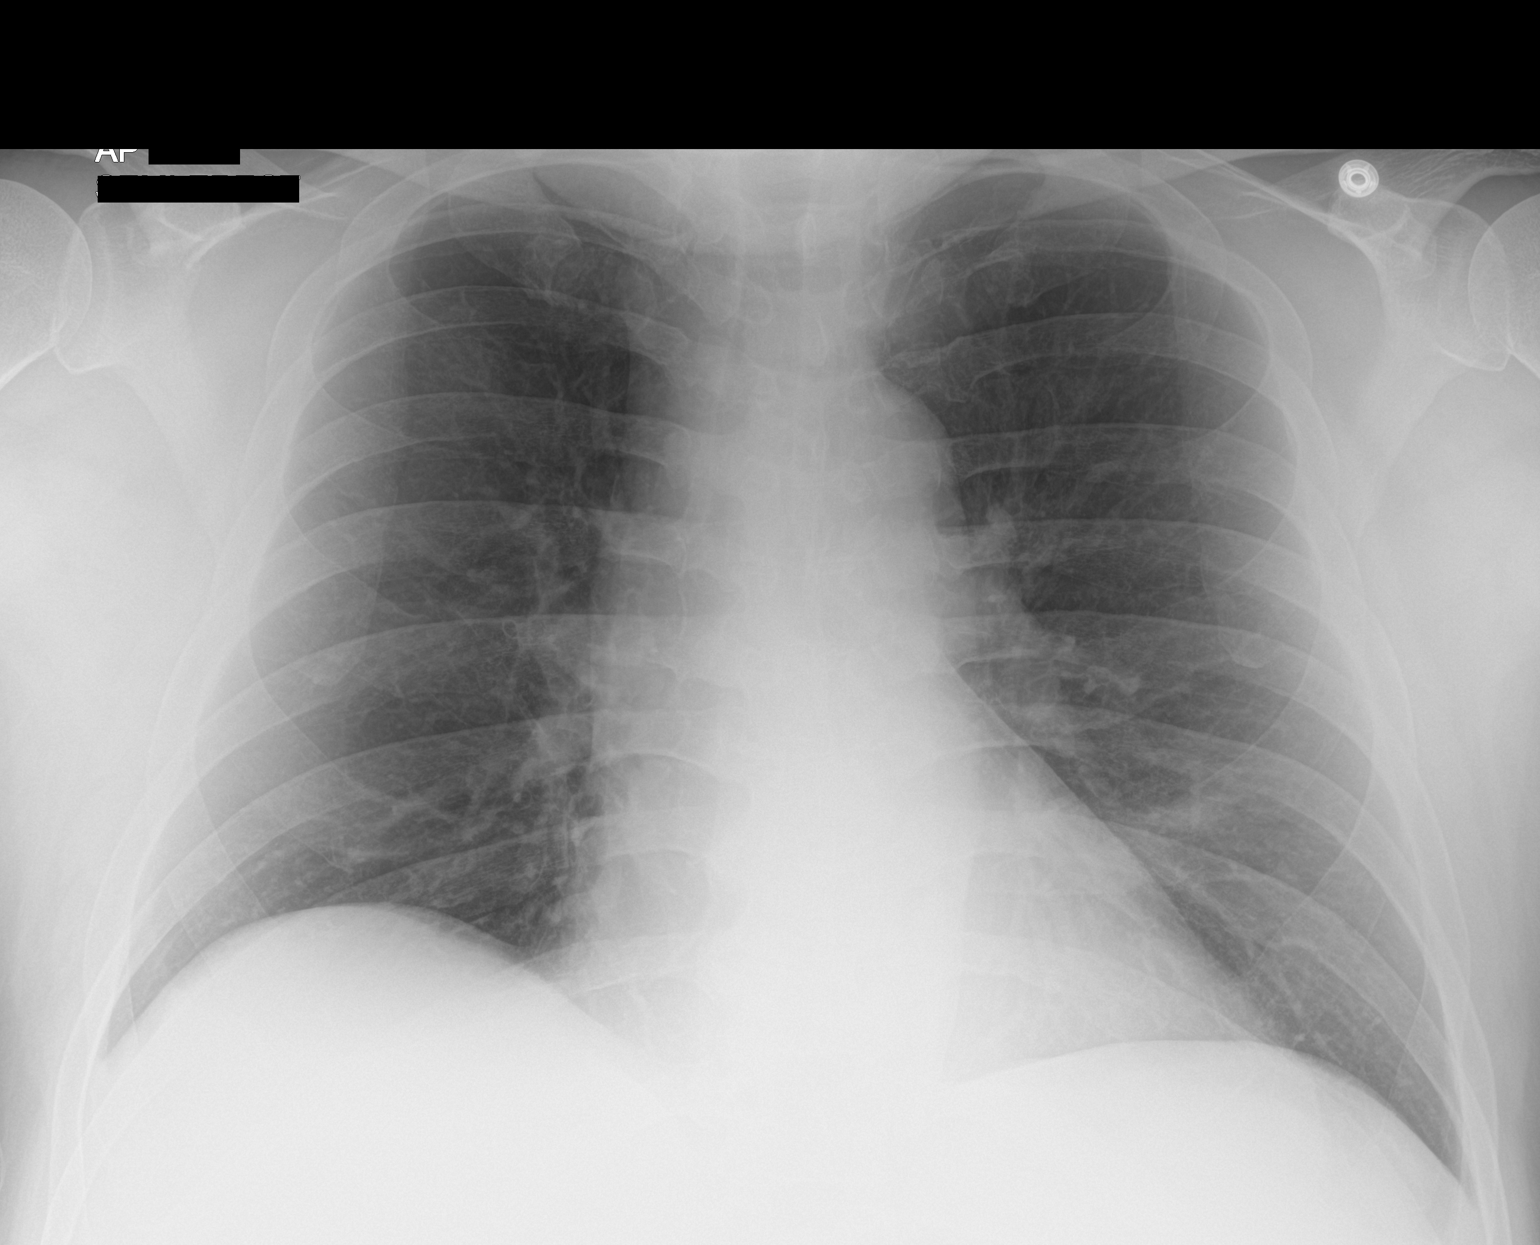

[1 of 1 positions shown; findings below may reference images not displayed]

FINDINGS: The heart size and mediastinal contours are within normal limits.
Both lungs are clear. The visualized skeletal structures are
unremarkable.
IMPRESSION: Negative chest.

## 2020-02-15 DIAGNOSIS — M5116 Intervertebral disc disorders with radiculopathy, lumbar region: Secondary | ICD-10-CM | POA: Diagnosis not present

## 2020-02-15 DIAGNOSIS — M4317 Spondylolisthesis, lumbosacral region: Secondary | ICD-10-CM | POA: Diagnosis not present

## 2020-02-15 DIAGNOSIS — R600 Localized edema: Secondary | ICD-10-CM | POA: Diagnosis not present

## 2020-02-15 DIAGNOSIS — M4726 Other spondylosis with radiculopathy, lumbar region: Secondary | ICD-10-CM | POA: Diagnosis not present

## 2020-02-29 DIAGNOSIS — M5441 Lumbago with sciatica, right side: Secondary | ICD-10-CM | POA: Diagnosis not present

## 2020-02-29 DIAGNOSIS — M4316 Spondylolisthesis, lumbar region: Secondary | ICD-10-CM | POA: Diagnosis not present

## 2020-02-29 DIAGNOSIS — M5442 Lumbago with sciatica, left side: Secondary | ICD-10-CM | POA: Diagnosis not present

## 2020-02-29 DIAGNOSIS — G4733 Obstructive sleep apnea (adult) (pediatric): Secondary | ICD-10-CM | POA: Diagnosis not present

## 2020-03-11 DIAGNOSIS — G4733 Obstructive sleep apnea (adult) (pediatric): Secondary | ICD-10-CM | POA: Diagnosis not present

## 2020-03-12 DIAGNOSIS — M545 Low back pain: Secondary | ICD-10-CM | POA: Diagnosis not present

## 2020-03-14 DIAGNOSIS — M545 Low back pain: Secondary | ICD-10-CM | POA: Diagnosis not present

## 2020-03-19 DIAGNOSIS — M47816 Spondylosis without myelopathy or radiculopathy, lumbar region: Secondary | ICD-10-CM | POA: Diagnosis not present

## 2020-03-19 DIAGNOSIS — M5137 Other intervertebral disc degeneration, lumbosacral region: Secondary | ICD-10-CM | POA: Diagnosis not present

## 2020-03-19 DIAGNOSIS — M2578 Osteophyte, vertebrae: Secondary | ICD-10-CM | POA: Diagnosis not present

## 2020-03-28 DIAGNOSIS — M545 Low back pain: Secondary | ICD-10-CM | POA: Diagnosis not present

## 2020-04-04 DIAGNOSIS — M545 Low back pain: Secondary | ICD-10-CM | POA: Diagnosis not present

## 2020-04-09 DIAGNOSIS — M545 Low back pain: Secondary | ICD-10-CM | POA: Diagnosis not present

## 2020-04-10 DIAGNOSIS — G4733 Obstructive sleep apnea (adult) (pediatric): Secondary | ICD-10-CM | POA: Diagnosis not present

## 2020-04-16 DIAGNOSIS — M545 Low back pain: Secondary | ICD-10-CM | POA: Diagnosis not present

## 2020-04-23 DIAGNOSIS — M545 Low back pain: Secondary | ICD-10-CM | POA: Diagnosis not present

## 2020-04-30 DIAGNOSIS — M545 Low back pain: Secondary | ICD-10-CM | POA: Diagnosis not present

## 2020-05-07 DIAGNOSIS — M545 Low back pain: Secondary | ICD-10-CM | POA: Diagnosis not present

## 2020-05-11 DIAGNOSIS — G4733 Obstructive sleep apnea (adult) (pediatric): Secondary | ICD-10-CM | POA: Diagnosis not present

## 2020-05-28 DIAGNOSIS — I10 Essential (primary) hypertension: Secondary | ICD-10-CM | POA: Diagnosis not present

## 2020-05-28 DIAGNOSIS — E782 Mixed hyperlipidemia: Secondary | ICD-10-CM | POA: Diagnosis not present

## 2020-05-28 DIAGNOSIS — Z23 Encounter for immunization: Secondary | ICD-10-CM | POA: Diagnosis not present

## 2020-05-28 DIAGNOSIS — L309 Dermatitis, unspecified: Secondary | ICD-10-CM | POA: Diagnosis not present

## 2020-06-11 DIAGNOSIS — G4733 Obstructive sleep apnea (adult) (pediatric): Secondary | ICD-10-CM | POA: Diagnosis not present

## 2020-06-18 DIAGNOSIS — G4733 Obstructive sleep apnea (adult) (pediatric): Secondary | ICD-10-CM | POA: Diagnosis not present

## 2020-06-21 DIAGNOSIS — G4733 Obstructive sleep apnea (adult) (pediatric): Secondary | ICD-10-CM | POA: Diagnosis not present

## 2020-07-01 ENCOUNTER — Telehealth: Payer: Self-pay | Admitting: *Deleted

## 2020-07-05 DIAGNOSIS — J3081 Allergic rhinitis due to animal (cat) (dog) hair and dander: Secondary | ICD-10-CM | POA: Diagnosis not present

## 2020-07-05 DIAGNOSIS — R21 Rash and other nonspecific skin eruption: Secondary | ICD-10-CM | POA: Diagnosis not present

## 2020-07-05 DIAGNOSIS — J3089 Other allergic rhinitis: Secondary | ICD-10-CM | POA: Diagnosis not present

## 2020-07-05 DIAGNOSIS — J301 Allergic rhinitis due to pollen: Secondary | ICD-10-CM | POA: Diagnosis not present

## 2020-07-11 DIAGNOSIS — G4733 Obstructive sleep apnea (adult) (pediatric): Secondary | ICD-10-CM | POA: Diagnosis not present

## 2020-08-11 DIAGNOSIS — G4733 Obstructive sleep apnea (adult) (pediatric): Secondary | ICD-10-CM | POA: Diagnosis not present

## 2020-08-13 DIAGNOSIS — L309 Dermatitis, unspecified: Secondary | ICD-10-CM | POA: Diagnosis not present

## 2020-09-03 DIAGNOSIS — B354 Tinea corporis: Secondary | ICD-10-CM | POA: Diagnosis not present

## 2020-09-10 DIAGNOSIS — G4733 Obstructive sleep apnea (adult) (pediatric): Secondary | ICD-10-CM | POA: Diagnosis not present

## 2020-09-15 ENCOUNTER — Other Ambulatory Visit: Payer: Self-pay | Admitting: Cardiology

## 2020-12-14 ENCOUNTER — Other Ambulatory Visit: Payer: Self-pay | Admitting: Cardiology

## 2020-12-16 ENCOUNTER — Other Ambulatory Visit: Payer: Self-pay | Admitting: Cardiology

## 2020-12-24 DIAGNOSIS — G4733 Obstructive sleep apnea (adult) (pediatric): Secondary | ICD-10-CM | POA: Diagnosis not present

## 2020-12-24 DIAGNOSIS — I10 Essential (primary) hypertension: Secondary | ICD-10-CM | POA: Diagnosis not present

## 2020-12-24 DIAGNOSIS — Z125 Encounter for screening for malignant neoplasm of prostate: Secondary | ICD-10-CM | POA: Diagnosis not present

## 2020-12-24 DIAGNOSIS — Z0000 Encounter for general adult medical examination without abnormal findings: Secondary | ICD-10-CM | POA: Diagnosis not present

## 2020-12-24 DIAGNOSIS — E782 Mixed hyperlipidemia: Secondary | ICD-10-CM | POA: Diagnosis not present

## 2021-01-06 ENCOUNTER — Telehealth: Payer: Self-pay | Admitting: Cardiovascular Disease

## 2021-01-06 NOTE — Telephone Encounter (Signed)
1) What problem are you experiencing? Wants to discuss getting another mask for his C-PAP machine.  2) Who is your medical equipment company? [employer]    Please route to the sleep study assistant.

## 2021-01-09 DIAGNOSIS — G4733 Obstructive sleep apnea (adult) (pediatric): Secondary | ICD-10-CM | POA: Diagnosis not present

## 2021-02-03 ENCOUNTER — Other Ambulatory Visit: Payer: Self-pay | Admitting: Cardiology

## 2021-02-04 ENCOUNTER — Other Ambulatory Visit: Payer: Self-pay | Admitting: Cardiology

## 2021-05-06 DIAGNOSIS — G4733 Obstructive sleep apnea (adult) (pediatric): Secondary | ICD-10-CM | POA: Diagnosis not present

## 2021-05-22 DIAGNOSIS — M84375A Stress fracture, left foot, initial encounter for fracture: Secondary | ICD-10-CM | POA: Diagnosis not present

## 2021-05-22 DIAGNOSIS — S92415A Nondisplaced fracture of proximal phalanx of left great toe, initial encounter for closed fracture: Secondary | ICD-10-CM | POA: Diagnosis not present

## 2021-05-23 ENCOUNTER — Telehealth: Payer: Self-pay | Admitting: Cardiology

## 2021-05-23 NOTE — Telephone Encounter (Signed)
[facility] calling for a copy of the pts sleep study

## 2021-05-30 ENCOUNTER — Telehealth: Payer: Self-pay | Admitting: *Deleted

## 2021-05-30 NOTE — Telephone Encounter (Signed)
Patient called to say that he is trying to get a oral appliance to see if he can get his OSA to 100%. He states that he wakes up throughout the night having mask issues. I told him that I pulled a download from [facility] and it shows that his AHI is good at 2.4. He states that he is still planning on using the CPAP, he's just reaching out to see if the oral appliance will work better than his CPAP. As of right now he is waking up tired in the mornings. He was informed that we do have the request for the oral appliance. I will inform Dr Claiborne Billings of why he wants it. If he agrees to sign the form I will get it faxed back to the company today. Otherwise I will call him back to let him know the outcome. Patient agrees with this plan.

## 2021-06-05 DIAGNOSIS — S92415D Nondisplaced fracture of proximal phalanx of left great toe, subsequent encounter for fracture with routine healing: Secondary | ICD-10-CM | POA: Diagnosis not present

## 2021-06-12 ENCOUNTER — Telehealth: Payer: Self-pay | Admitting: Cardiovascular Disease

## 2021-06-12 ENCOUNTER — Other Ambulatory Visit: Payer: Self-pay | Admitting: Cardiology

## 2021-06-12 NOTE — Telephone Encounter (Signed)
New message:     [facility] would like progress notes from 09/18/19 please fax [phone]. Please call if any questions.

## 2021-06-13 NOTE — Telephone Encounter (Signed)
[facility] called back to see if Dr. Claiborne Billings can add "patient can not tolerate cpap machine." right above his signature. Please advise

## 2021-06-13 NOTE — Telephone Encounter (Signed)
09/14/19 office note and 09/18/19 sleep study faxed to [facility] per their request to fax number provided. [phone].

## 2021-06-13 NOTE — Telephone Encounter (Signed)
A call was returned to [facility]. Spoke with Lelan Pons and informed her that we cannot write the statement she is requesting. The report in [facility] shows the patient is using his CPAP machine nightly and his current AHI is 2.0. the patient has not been seen by Dr Claiborne Billings since January 2021.

## 2021-06-13 NOTE — Telephone Encounter (Addendum)
Routed to sleep coordinator to review/follow up

## 2021-06-14 ENCOUNTER — Other Ambulatory Visit: Payer: Self-pay | Admitting: Cardiovascular Disease

## 2021-06-19 DIAGNOSIS — S92415D Nondisplaced fracture of proximal phalanx of left great toe, subsequent encounter for fracture with routine healing: Secondary | ICD-10-CM | POA: Diagnosis not present

## 2021-06-25 ENCOUNTER — Telehealth: Payer: Self-pay | Admitting: Cardiovascular Disease

## 2021-06-25 NOTE — Telephone Encounter (Signed)
New message:     Levonne Spiller calling from [facility]. Patient can not use the CPAP. Please advise there was a sign order form.

## 2021-07-02 NOTE — Telephone Encounter (Signed)
[facility] reaching out in regards to previous note... please advise

## 2021-07-03 NOTE — Telephone Encounter (Signed)
Form has been received. Per Dr . Claiborne Billings he will review when he is back in the office next week.

## 2021-07-09 NOTE — Telephone Encounter (Signed)
Routed to sleep coordinator to follow up

## 2021-07-09 NOTE — Telephone Encounter (Signed)
Form faxed back today with Dr. Evette Georges response to [phone].

## 2021-07-09 NOTE — Telephone Encounter (Signed)
Mckenna from [facility] was calling to follow up the form that he faxed over

## 2021-07-09 NOTE — Telephone Encounter (Signed)
M

## 2021-07-29 ENCOUNTER — Telehealth: Payer: Self-pay | Admitting: Cardiovascular Disease

## 2021-07-29 DIAGNOSIS — G4733 Obstructive sleep apnea (adult) (pediatric): Secondary | ICD-10-CM | POA: Diagnosis not present

## 2021-07-29 NOTE — Telephone Encounter (Signed)
Anthony Golden is calling stating he is returning [name]'s call from yesterday.

## 2021-07-31 NOTE — Telephone Encounter (Signed)
-----   Message from Imagene Gurney sent at 07/28/2021  4:43 PM EDT ----- Anthony Golden,  Patient is requesting to speak with you. Says he can wait until tomorrow.  [phone]  Thank you!

## 2021-07-31 NOTE — Telephone Encounter (Signed)
Left patient a message his call is being returned. Please call me again if assistance is still needed.

## 2021-08-05 ENCOUNTER — Encounter: Payer: Self-pay | Admitting: Cardiovascular Disease

## 2021-08-05 ENCOUNTER — Other Ambulatory Visit: Payer: Self-pay

## 2021-08-05 ENCOUNTER — Ambulatory Visit: Payer: Federal, State, Local not specified - PPO | Admitting: Cardiovascular Disease

## 2021-08-05 VITALS — BP 120/74 | HR 89 | Ht 70.0 in | Wt 245.0 lb

## 2021-08-05 DIAGNOSIS — R931 Abnormal findings on diagnostic imaging of heart and coronary circulation: Secondary | ICD-10-CM | POA: Diagnosis not present

## 2021-08-05 DIAGNOSIS — E669 Obesity, unspecified: Secondary | ICD-10-CM | POA: Diagnosis not present

## 2021-08-05 DIAGNOSIS — I10 Essential (primary) hypertension: Secondary | ICD-10-CM | POA: Diagnosis not present

## 2021-08-05 DIAGNOSIS — G4733 Obstructive sleep apnea (adult) (pediatric): Secondary | ICD-10-CM | POA: Diagnosis not present

## 2021-08-05 NOTE — Progress Notes (Signed)
Cardiology Office Note    Date:  08/23/2021   ID:  Anthony Golden, DOB 1966/02/01, MRN [id number]  PCP:  Donald Prose, MD  Cardiologist:  Shelva Majestic, MD (sleep); Dr. 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 Percival Spanish for primary cardiology care.  He was recently found to have significant sleep apnea and has initiated CPAP therapy.  I saw him for initial evaluation in October 25, 2019.  He presents for a 21 month follow-up evaluation.  Anthony Golden is originally from [place], [place].  He has a history of previously documented sleep apnea  reportedly over 10 years ago had a sleep study and was on CPAP therapy.  He apparently stopped using therapy and has not been on treatment for over 10 years.  Following a divorce, he moved  from [place] to [facility].  He had developed a worsening cough leading to an ER evaluation on July 26, 2019 for suspected Covid.  At that time he did have some shortness of breath.  A chest x-ray did not show any acute process.  He was felt to have a mild course of infection and was not hospitalized with self-isolation at home.  He has seen Dr. Percival Spanish for a chest pain and had undergone a calcium score which was increased at 53, representing 86 percentile for age and sex matched control.  With evidence for calcium in the proximal and mid LAD and distal RCA with significant mitral annular calcification.  He subsequently had a routine treadmill test.   Due to recent progressive symptoms of sleep apnea manifested as fatigue, daytime sleepiness, nonrestorative sleep, and snoring, he was referred for a sleep study which was done on August 29, 2019.  This revealed severe obstructive sleep apnea in a split-night protocol with overall AHI 81.4/h.  He had severe oxygen desaturation to a nadir of 66% during the diagnostic portion of the study.  CPAP was initiated and was  titrated up to 12 cm water pressure with excellent benefit.  He has subsequently been on CPAP therapy since his set up date on October 12, 2019.  Since initiating therapy, he has been 100% compliant and has been averaging 5 hours and 15 minutes of CPAP use per night.  AHI is 0.8.  He has been using a Respironics nasal pro gel medium size mask but was recently switched to a ResMed N 20 large mask which he prefers.  Since initiating CPAP therapy, he has noticed dramatic benefit and is now sleeping significantly better.  His nocturia has significantly improved.  His sleep is restorative.  He denies any residual daytime sleepiness.  An Epworth Sleepiness Scale score was calculated in the office  and this endorsed at 3 as shown below:  Epworth Sleepiness Scale: Situation   Chance of Dozing/Sleeping (0 = never , 1 = slight chance , 2 = moderate chance , 3 = high chance )   sitting and reading 0   watching TV 1   sitting inactive in a public place 0   being a passenger in a motor vehicle for an hour or more 1   lying down in the afternoon 1   sitting and talking to someone 0   sitting quietly after lunch (no alcohol) 0   while stopped for a few minutes in traffic as the driver 0   Total Score  3  When I initially saw him he felt significantly improved with treatment of residual snoring,  restless legs, bruxism, hypnagogic hallucinations or cataplectic events.  He was no longer having morning headaches, sleep is restorative and there was resolution of prior snoring.  He also noted significant improvement in sleep apnea related nocturia.  Since I last saw him, he has continued to use CPAP therapy.  At times he has had some difficulty with his mask  He received a new mask which is an Arts development officer  F 20 with improvement.  I obtained a download from October 9 through August 04, 2021.  Usage is 100%.  There is minimal if any mask leak 2 nights.  He has been set at a pressure of 12 cm and AHI is 4.2.  He believes  he is sleeping well.  He is unaware of breakthrough snoring.  He presents for evaluation.   Past Medical History:  Diagnosis Date   Hypertension    Sleep apnea    Does not use CPAP    Past Surgical History:  Procedure Laterality Date   TMJ ARTHROSCOPY      Current Medications: Outpatient Medications Prior to Visit  Medication Sig Dispense Refill   fluticasone (FLONASE) 50 MCG/ACT nasal spray Place 1 spray into both nostrils daily as needed for allergies or rhinitis.     lisinopril (ZESTRIL) 20 MG tablet Take 20 mg by mouth daily.     pravastatin (PRAVACHOL) 40 MG tablet TAKE 1 TABLET(40 MG) BY MOUTH EVERY EVENING 30 tablet 1   pantoprazole (PROTONIX) 40 MG tablet Take 1 tablet (40 mg total) by mouth daily. 30 tablet 0   DULoxetine (CYMBALTA) 30 MG capsule Take 1 capsule (30 mg total) by mouth daily. (Patient not taking: Reported on 01/09/2020) 30 capsule 0   fenofibrate 160 MG tablet Take 1 tablet (160 mg total) by mouth daily. (Patient not taking: Reported on 01/09/2020) 30 tablet 0   lisinopril-hydrochlorothiazide (ZESTORETIC) 20-12.5 MG tablet Take 1 tablet by mouth daily. (Patient not taking: Reported on 08/05/2021)     No facility-administered medications prior to visit.     Allergies:   Bee venom   Social History   Socioeconomic History   Marital status: Single    Spouse name: Not on file   Number of children: Not on file   Years of education: Not on file   Highest education level: Not on file  Occupational History   Not on file  Tobacco Use   Smoking status: Never   Smokeless tobacco: Never  Vaping Use   Vaping Use: Never used  Substance and Sexual Activity   Alcohol use: Yes    Comment: Pt report occasianly drinking 1/2 beers per month   Drug use: No   Sexual activity: Not Currently  Other Topics Concern   Not on file  Social History Narrative   Not on file   Social Determinants of Health   Financial Resource Strain: Not on file  Food Insecurity: Not on  file  Transportation Needs: Not on file  Physical Activity: Not on file  Stress: Not on file  Social Connections: Not on file     Social history is notable that he is divorced.  He has no children.  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  No tobacco history.  Family History:  The patient's family history includes COPD in his mother; Cancer in his mother; Heart disease in his father.  His mother is alive at age 12.  Father died at age 21 and had congestive heart failure.  He has 4 brothers, 1 with obstructive sleep apnea.  He has 1 sister.  ROS General: Negative; No fevers, chills, or night sweats; obesity HEENT: Negative; No changes in vision or hearing, sinus congestion, difficulty swallowing Pulmonary: Negative; No cough, wheezing, shortness of breath, hemoptysis Cardiovascular: Coronary calcification on CT, history of hypertension and hyperlipidemia GI: Negative; No nausea, vomiting, diarrhea, or abdominal pain GU: Negative; No dysuria, hematuria, or difficulty voiding Musculoskeletal: Negative; no myalgias, joint pain, or weakness Hematologic/Oncology: Negative; no easy bruising, bleeding Endocrine: Negative; no heat/cold intolerance; no diabetes Neuro: Negative; no changes in balance, headaches Skin: Negative; No rashes or skin lesions Psychiatric: Negative; No behavioral problems, depression Sleep: See HPI Other comprehensive 14 point system review is negative.   PHYSICAL EXAM:   VS:  BP 120/74 (BP Location: Left Arm, Patient Position: Sitting, Cuff Size: Large)   Pulse 89   Ht _0  (1.778 m)   Wt 245 lb (111.1 kg)   BMI 35.15 kg/m     Repeat blood pressure by me 130/76  Wt Readings from Last 3 Encounters:  08/05/21 245 lb (111.1 kg)  10/25/19 247 lb 6.4 oz (112.2 kg)  09/18/19 234 lb (106.1 kg)    General: Alert, oriented, no distress.  Skin: normal turgor, no rashes, warm and dry HEENT: Normocephalic, atraumatic. Pupils equal round and reactive to light;  sclera anicteric; extraocular muscles intact;  Nose without nasal septal hypertrophy Mouth/Parynx benign; Mallinpatti scale 3 Neck: No JVD, no carotid bruits; normal carotid upstroke Lungs: clear to ausculatation and percussion; no wheezing or rales Chest wall: without tenderness to palpitation Heart: PMI not displaced, RRR, s1 s2 normal, 1/6 systolic murmur, no diastolic murmur, no rubs, gallops, thrills, or heaves Abdomen: soft, nontender; no hepatosplenomehaly, BS+; abdominal aorta nontender and not dilated by palpation. Back: no CVA tenderness Pulses 2+ Musculoskeletal: full range of motion, normal strength, no joint deformities Extremities: no clubbing cyanosis or edema, Homan's sign negative  Neurologic: grossly nonfocal; Cranial nerves grossly wnl Psychologic: Normal mood and affect   Studies/Labs Reviewed:   August 05, 2021 ECG (independently read by me): NSR at 89, LAHB, QTc 474 msec  October 25, 2019 ECG (independently read by me): NSR at 94; LAFB, QTc 472 msec; no ectopy  Recent Labs: BMP Latest Ref Rng & Units 09/05/2019 06/08/2019  Glucose 65 - 99 mg/dL 91 94  BUN 6 - 24 mg/dL 13 11  Creatinine 0.76 - 1.27 mg/dL 0.68(L) 0.78  BUN/Creat Ratio 9 - 20 19 -  Sodium 134 - 144 mmol/L 136 136  Potassium 3.5 - 5.2 mmol/L 4.3 3.8  Chloride 96 - 106 mmol/L 99 99  CO2 20 - 29 mmol/L 22 23  Calcium 8.7 - 10.2 mg/dL 9.8 9.0     Hepatic Function Latest Ref Rng & Units 06/10/2019 06/08/2019  Total Protein 6.5 - 8.1 g/dL 7.7 7.2  Albumin 3.5 - 5.0 g/dL 4.2 4.1  AST 15 - 41 U/L 86(H) 188(H)  ALT 0 - 44 U/L 104(H) 146(H)  Alk Phosphatase 38 - 126 U/L 59 55  Total Bilirubin 0.3 - 1.2 mg/dL 2.5(H) 1.2  Bilirubin, Direct 0.0 - 0.2 mg/dL 0.5(H) -    CBC Latest Ref Rng & Units 06/08/2019  WBC 4.0 - 10.5 K/uL 8.1  Hemoglobin 13.0 - 17.0 g/dL 15.8  Hematocrit 39.0 - 52.0 % 46.2  Platelets 150 - 400 K/uL 269   Lab Results  Component Value Date  MCV 94.7 06/08/2019   Lab  Results  Component Value Date   TSH 3.599 06/08/2019   Lab Results  Component Value Date   HGBA1C 5.4 06/08/2019     BNP    Component Value Date/Time   BNP 31.4 06/08/2019 1837    ProBNP No results found for: PROBNP   Lipid Panel     Component Value Date/Time   CHOL 185 09/05/2019 0857   TRIG 268 (H) 09/05/2019 0857   HDL 42 09/05/2019 0857   CHOLHDL 4.4 09/05/2019 0857   CHOLHDL 5.4 06/08/2019 1837   VLDL UNABLE TO CALCULATE IF TRIGLYCERIDE OVER 400 mg/dL 06/08/2019 1837   LDLCALC 98 09/05/2019 0857   LDLDIRECT 30.0 06/08/2019 1837   LABVLDL 45 (H) 09/05/2019 0857     RADIOLOGY: No results found.   Additional studies/ records that were reviewed today include:  I reviewed the patient's ER Covid evaluation, evaluation of Dr. Percival Spanish, his coronary calcium CT score, stress test, as well as split-night sleep study and obtained a download since CPAP initiation and recalculated an Epworth Sleepiness Scale score.  And a download was obtained from October 9 through August 04, 2021.   ASSESSMENT:    1. OSA (obstructive sleep apnea)   2. Elevated coronary artery calcium score   3. Essential hypertension   4. Obesity, Class II, BMI 35-39.9     PLAN:  Anthony Golden is a 48 -year-old gentleman who reportedly was diagnosed with sleep apnea well over 12 years ago and had used CPAP for short duration.  He had not been on therapy for over 10 years.  He has a history of hypertension, obesity, hyperlipidemia, and is status post a recent mild Covid infection for which he stayed at home following an ER evaluation.  He has been documented to have coronary calcification and coronary calcium score is increased at 232, representing 91st percentile for age and sex matched control.  A routine treadmill test was negative for ischemia.  His split-night sleep evaluation  on September 18, 2019 demonstrated very severe sleep apnea with an AHI of 81.4/h on the diagnostic portion of the study  with severe oxygen desaturation to a nadir of 66%.  CPAP was initiated and titrated to optimal pressure 12 cm.  He was significantly symptomatic prior to initiating CPAP therapy.  When I saw him for my initial evaluation with me in January 2021 he has been CPAP therapy since October 12, 2019 and his download since initiation confirms 100% compliance with excellent AHI at 0.2/h. At my initial sleep evaluation, I had a long discussion with him and reviewed normal sleep architecture and the effects of sleep apnea and sleep architecture disruption.  I specifically discussed his severe hypoxemia at night as a potential cause of significant nocturnal arrhythmias as well as potential coronary as well as cerebrovascular ischemia.  I discussed implications of untreated sleep apnea particularly with reference to blood pressure, atrial fibrillation, glucose intolerance, inflammation as well as GERD.  Since initiating CPAP his symptoms have dramatically improved.  He no longer is having morning headaches.  His sleep is restored.  There is no daytime sleepiness and there is resolution of prior significant snoring.  I discussed the physiology associated with significant sleep apnea related nocturia.  We discussed optimal sleep duration with CPAP therapy ideally at 8 hours per night.  He is moderately obese.  We discussed the importance of weight loss and exercise both on his cardiovascular health as well as its effects on sleep  apnea.  Since I last saw him, he has continued to use CPAP with excellent compliance.  On his most recent download from October 9 through August 04, 2021 although usage was 100% average use was only 5 hours of 32 minutes.  I again discussed optimal sleep duration for an adult between 7 and 9 hours.  His current setting is set at a pressure of 12 cm and AHI of 4.2.  He is doing better with his new AirTouch F 20 mask.  Presently, I have recommended adjustment to his CPAP parameters.  I will change his ramp  start pressure to 6 from 4.  I will also changing from a set pressure of 12 cm to an auto pressure range of 12 to 18 cm of water.  We discussed the importance of continued weight loss and exercise.  He has previously been noted to have coronary calcification and continued aggressive lifestyle adjustment with optimal lipid therapy is essential in attempt to induce plaque stability and hopeful regression.  He continues to be on lisinopril 20 mg for hypetension and pravastatin 40 mg for hyperlipidemia he will follow-up with Dr. Percival Spanish for his primary cardiology care.  I will see him in 1 year for sleep reevaluation or sooner as needed.    Medication Adjustments/Labs and Tests Ordered: Current medicines are reviewed at length with the patient today.  Concerns regarding medicines are outlined above.  Medication changes, Labs and Tests ordered today are listed in the Patient Instructions below. Patient Instructions  Medication Instructions:  Continue current medications. No changes.  *If you need a refill on your cardiac medications before your next appointment, please call your pharmacy*   Follow-Up: At [facility], you and your health needs are our priority.  As part of our continuing mission to provide you with exceptional heart care, we have created designated Provider Care Teams.  These Care Teams include your primary Cardiologist (physician) and Advanced Practice Providers (APPs -  Physician Assistants and Nurse Practitioners) who all work together to provide you with the care you need, when you need it.  We recommend signing up for the patient portal called "MyChart".  Sign up information is provided on this After Visit Summary.  MyChart is used to connect with patients for Virtual Visits (Telemedicine).  Patients are able to view lab/test results, encounter notes, upcoming appointments, etc.  Non-urgent messages can be sent to your provider as well.   To learn more about what you can do with  MyChart, go to [email or web address].    Your next appointment:   12 month(s)  The format for your next appointment:   In Person  Provider:   Corky Downs, MD    Signed, Shelva Majestic, MD  08/23/2021 5:39 PM    [facility] [address], [place], [place], [place]  [address] Phone: [phone]

## 2021-08-05 NOTE — Patient Instructions (Signed)
Medication Instructions:  Continue current medications. No changes.  *If you need a refill on your cardiac medications before your next appointment, please call your pharmacy*   Follow-Up: At [facility], you and your health needs are our priority.  As part of our continuing mission to provide you with exceptional heart care, we have created designated Provider Care Teams.  These Care Teams include your primary Cardiologist (physician) and Advanced Practice Providers (APPs -  Physician Assistants and Nurse Practitioners) who all work together to provide you with the care you need, when you need it.  We recommend signing up for the patient portal called "MyChart".  Sign up information is provided on this After Visit Summary.  MyChart is used to connect with patients for Virtual Visits (Telemedicine).  Patients are able to view lab/test results, encounter notes, upcoming appointments, etc.  Non-urgent messages can be sent to your provider as well.   To learn more about what you can do with MyChart, go to [email or web address].    Your next appointment:   12 month(s)  The format for your next appointment:   In Person  Provider:   Corky Downs, MD

## 2021-08-23 ENCOUNTER — Encounter: Payer: Self-pay | Admitting: Cardiovascular Disease

## 2021-10-21 ENCOUNTER — Other Ambulatory Visit: Payer: Self-pay | Admitting: Cardiovascular Disease

## 2021-12-16 DIAGNOSIS — Z006 Encounter for examination for normal comparison and control in clinical research program: Secondary | ICD-10-CM | POA: Diagnosis not present

## 2021-12-16 DIAGNOSIS — M47816 Spondylosis without myelopathy or radiculopathy, lumbar region: Secondary | ICD-10-CM | POA: Diagnosis not present

## 2021-12-16 DIAGNOSIS — M4316 Spondylolisthesis, lumbar region: Secondary | ICD-10-CM | POA: Diagnosis not present

## 2021-12-31 DIAGNOSIS — I10 Essential (primary) hypertension: Secondary | ICD-10-CM | POA: Diagnosis not present

## 2021-12-31 DIAGNOSIS — Z125 Encounter for screening for malignant neoplasm of prostate: Secondary | ICD-10-CM | POA: Diagnosis not present

## 2021-12-31 DIAGNOSIS — Z0000 Encounter for general adult medical examination without abnormal findings: Secondary | ICD-10-CM | POA: Diagnosis not present

## 2021-12-31 DIAGNOSIS — E782 Mixed hyperlipidemia: Secondary | ICD-10-CM | POA: Diagnosis not present

## 2022-01-05 DIAGNOSIS — G4733 Obstructive sleep apnea (adult) (pediatric): Secondary | ICD-10-CM | POA: Diagnosis not present

## 2022-02-11 DIAGNOSIS — G4733 Obstructive sleep apnea (adult) (pediatric): Secondary | ICD-10-CM | POA: Diagnosis not present

## 2022-05-12 DIAGNOSIS — M5451 Vertebrogenic low back pain: Secondary | ICD-10-CM | POA: Diagnosis not present

## 2022-05-12 DIAGNOSIS — M542 Cervicalgia: Secondary | ICD-10-CM | POA: Diagnosis not present

## 2022-05-12 DIAGNOSIS — M9901 Segmental and somatic dysfunction of cervical region: Secondary | ICD-10-CM | POA: Diagnosis not present

## 2022-05-12 DIAGNOSIS — M9903 Segmental and somatic dysfunction of lumbar region: Secondary | ICD-10-CM | POA: Diagnosis not present

## 2022-05-13 DIAGNOSIS — M5451 Vertebrogenic low back pain: Secondary | ICD-10-CM | POA: Diagnosis not present

## 2022-05-13 DIAGNOSIS — M542 Cervicalgia: Secondary | ICD-10-CM | POA: Diagnosis not present

## 2022-05-13 DIAGNOSIS — M9903 Segmental and somatic dysfunction of lumbar region: Secondary | ICD-10-CM | POA: Diagnosis not present

## 2022-05-13 DIAGNOSIS — M9901 Segmental and somatic dysfunction of cervical region: Secondary | ICD-10-CM | POA: Diagnosis not present

## 2022-05-14 DIAGNOSIS — M542 Cervicalgia: Secondary | ICD-10-CM | POA: Diagnosis not present

## 2022-05-14 DIAGNOSIS — M9903 Segmental and somatic dysfunction of lumbar region: Secondary | ICD-10-CM | POA: Diagnosis not present

## 2022-05-14 DIAGNOSIS — M5451 Vertebrogenic low back pain: Secondary | ICD-10-CM | POA: Diagnosis not present

## 2022-05-14 DIAGNOSIS — M9901 Segmental and somatic dysfunction of cervical region: Secondary | ICD-10-CM | POA: Diagnosis not present

## 2022-05-15 DIAGNOSIS — M542 Cervicalgia: Secondary | ICD-10-CM | POA: Diagnosis not present

## 2022-05-15 DIAGNOSIS — M9901 Segmental and somatic dysfunction of cervical region: Secondary | ICD-10-CM | POA: Diagnosis not present

## 2022-05-15 DIAGNOSIS — M5451 Vertebrogenic low back pain: Secondary | ICD-10-CM | POA: Diagnosis not present

## 2022-05-15 DIAGNOSIS — M9903 Segmental and somatic dysfunction of lumbar region: Secondary | ICD-10-CM | POA: Diagnosis not present

## 2022-05-19 DIAGNOSIS — M542 Cervicalgia: Secondary | ICD-10-CM | POA: Diagnosis not present

## 2022-05-19 DIAGNOSIS — M9903 Segmental and somatic dysfunction of lumbar region: Secondary | ICD-10-CM | POA: Diagnosis not present

## 2022-05-19 DIAGNOSIS — M5451 Vertebrogenic low back pain: Secondary | ICD-10-CM | POA: Diagnosis not present

## 2022-05-19 DIAGNOSIS — M9901 Segmental and somatic dysfunction of cervical region: Secondary | ICD-10-CM | POA: Diagnosis not present

## 2022-05-20 DIAGNOSIS — M542 Cervicalgia: Secondary | ICD-10-CM | POA: Diagnosis not present

## 2022-05-20 DIAGNOSIS — M9903 Segmental and somatic dysfunction of lumbar region: Secondary | ICD-10-CM | POA: Diagnosis not present

## 2022-05-20 DIAGNOSIS — M9901 Segmental and somatic dysfunction of cervical region: Secondary | ICD-10-CM | POA: Diagnosis not present

## 2022-05-20 DIAGNOSIS — M5451 Vertebrogenic low back pain: Secondary | ICD-10-CM | POA: Diagnosis not present

## 2022-05-21 DIAGNOSIS — M542 Cervicalgia: Secondary | ICD-10-CM | POA: Diagnosis not present

## 2022-05-21 DIAGNOSIS — M9901 Segmental and somatic dysfunction of cervical region: Secondary | ICD-10-CM | POA: Diagnosis not present

## 2022-05-21 DIAGNOSIS — M5451 Vertebrogenic low back pain: Secondary | ICD-10-CM | POA: Diagnosis not present

## 2022-05-21 DIAGNOSIS — M9903 Segmental and somatic dysfunction of lumbar region: Secondary | ICD-10-CM | POA: Diagnosis not present

## 2022-05-22 ENCOUNTER — Other Ambulatory Visit: Payer: Self-pay | Admitting: Cardiovascular Disease

## 2022-05-26 DIAGNOSIS — M9901 Segmental and somatic dysfunction of cervical region: Secondary | ICD-10-CM | POA: Diagnosis not present

## 2022-05-26 DIAGNOSIS — M542 Cervicalgia: Secondary | ICD-10-CM | POA: Diagnosis not present

## 2022-05-26 DIAGNOSIS — M9903 Segmental and somatic dysfunction of lumbar region: Secondary | ICD-10-CM | POA: Diagnosis not present

## 2022-05-26 DIAGNOSIS — M5451 Vertebrogenic low back pain: Secondary | ICD-10-CM | POA: Diagnosis not present

## 2022-05-27 DIAGNOSIS — M542 Cervicalgia: Secondary | ICD-10-CM | POA: Diagnosis not present

## 2022-05-27 DIAGNOSIS — M9901 Segmental and somatic dysfunction of cervical region: Secondary | ICD-10-CM | POA: Diagnosis not present

## 2022-05-27 DIAGNOSIS — M5451 Vertebrogenic low back pain: Secondary | ICD-10-CM | POA: Diagnosis not present

## 2022-05-27 DIAGNOSIS — M9903 Segmental and somatic dysfunction of lumbar region: Secondary | ICD-10-CM | POA: Diagnosis not present

## 2022-05-28 DIAGNOSIS — M9903 Segmental and somatic dysfunction of lumbar region: Secondary | ICD-10-CM | POA: Diagnosis not present

## 2022-05-28 DIAGNOSIS — M9901 Segmental and somatic dysfunction of cervical region: Secondary | ICD-10-CM | POA: Diagnosis not present

## 2022-05-28 DIAGNOSIS — M5451 Vertebrogenic low back pain: Secondary | ICD-10-CM | POA: Diagnosis not present

## 2022-05-28 DIAGNOSIS — M542 Cervicalgia: Secondary | ICD-10-CM | POA: Diagnosis not present

## 2022-06-02 DIAGNOSIS — G4733 Obstructive sleep apnea (adult) (pediatric): Secondary | ICD-10-CM | POA: Diagnosis not present

## 2022-06-02 DIAGNOSIS — M9901 Segmental and somatic dysfunction of cervical region: Secondary | ICD-10-CM | POA: Diagnosis not present

## 2022-06-02 DIAGNOSIS — M9903 Segmental and somatic dysfunction of lumbar region: Secondary | ICD-10-CM | POA: Diagnosis not present

## 2022-06-02 DIAGNOSIS — M5451 Vertebrogenic low back pain: Secondary | ICD-10-CM | POA: Diagnosis not present

## 2022-06-02 DIAGNOSIS — M542 Cervicalgia: Secondary | ICD-10-CM | POA: Diagnosis not present

## 2022-06-03 DIAGNOSIS — M9903 Segmental and somatic dysfunction of lumbar region: Secondary | ICD-10-CM | POA: Diagnosis not present

## 2022-06-03 DIAGNOSIS — M5451 Vertebrogenic low back pain: Secondary | ICD-10-CM | POA: Diagnosis not present

## 2022-06-03 DIAGNOSIS — M542 Cervicalgia: Secondary | ICD-10-CM | POA: Diagnosis not present

## 2022-06-03 DIAGNOSIS — M9901 Segmental and somatic dysfunction of cervical region: Secondary | ICD-10-CM | POA: Diagnosis not present

## 2022-06-04 DIAGNOSIS — M542 Cervicalgia: Secondary | ICD-10-CM | POA: Diagnosis not present

## 2022-06-04 DIAGNOSIS — M5451 Vertebrogenic low back pain: Secondary | ICD-10-CM | POA: Diagnosis not present

## 2022-06-04 DIAGNOSIS — M9901 Segmental and somatic dysfunction of cervical region: Secondary | ICD-10-CM | POA: Diagnosis not present

## 2022-06-04 DIAGNOSIS — M9903 Segmental and somatic dysfunction of lumbar region: Secondary | ICD-10-CM | POA: Diagnosis not present

## 2022-06-08 DIAGNOSIS — M542 Cervicalgia: Secondary | ICD-10-CM | POA: Diagnosis not present

## 2022-06-08 DIAGNOSIS — M9903 Segmental and somatic dysfunction of lumbar region: Secondary | ICD-10-CM | POA: Diagnosis not present

## 2022-06-08 DIAGNOSIS — M9901 Segmental and somatic dysfunction of cervical region: Secondary | ICD-10-CM | POA: Diagnosis not present

## 2022-06-08 DIAGNOSIS — M5451 Vertebrogenic low back pain: Secondary | ICD-10-CM | POA: Diagnosis not present

## 2022-06-09 DIAGNOSIS — M2022 Hallux rigidus, left foot: Secondary | ICD-10-CM | POA: Diagnosis not present

## 2022-06-09 DIAGNOSIS — G5761 Lesion of plantar nerve, right lower limb: Secondary | ICD-10-CM | POA: Diagnosis not present

## 2022-06-11 DIAGNOSIS — M542 Cervicalgia: Secondary | ICD-10-CM | POA: Diagnosis not present

## 2022-06-11 DIAGNOSIS — M5451 Vertebrogenic low back pain: Secondary | ICD-10-CM | POA: Diagnosis not present

## 2022-06-11 DIAGNOSIS — M9903 Segmental and somatic dysfunction of lumbar region: Secondary | ICD-10-CM | POA: Diagnosis not present

## 2022-06-11 DIAGNOSIS — M9901 Segmental and somatic dysfunction of cervical region: Secondary | ICD-10-CM | POA: Diagnosis not present

## 2022-06-19 DIAGNOSIS — Z03818 Encounter for observation for suspected exposure to other biological agents ruled out: Secondary | ICD-10-CM | POA: Diagnosis not present

## 2022-06-19 DIAGNOSIS — R059 Cough, unspecified: Secondary | ICD-10-CM | POA: Diagnosis not present

## 2022-06-19 DIAGNOSIS — R5383 Other fatigue: Secondary | ICD-10-CM | POA: Diagnosis not present

## 2022-06-19 DIAGNOSIS — R0981 Nasal congestion: Secondary | ICD-10-CM | POA: Diagnosis not present

## 2022-07-13 DIAGNOSIS — I10 Essential (primary) hypertension: Secondary | ICD-10-CM | POA: Diagnosis not present

## 2022-07-13 DIAGNOSIS — M549 Dorsalgia, unspecified: Secondary | ICD-10-CM | POA: Diagnosis not present

## 2022-07-14 DIAGNOSIS — G5762 Lesion of plantar nerve, left lower limb: Secondary | ICD-10-CM | POA: Diagnosis not present

## 2022-08-04 DIAGNOSIS — G5761 Lesion of plantar nerve, right lower limb: Secondary | ICD-10-CM | POA: Diagnosis not present

## 2022-08-04 DIAGNOSIS — G5762 Lesion of plantar nerve, left lower limb: Secondary | ICD-10-CM | POA: Diagnosis not present

## 2022-09-01 DIAGNOSIS — G5762 Lesion of plantar nerve, left lower limb: Secondary | ICD-10-CM | POA: Diagnosis not present

## 2022-09-01 DIAGNOSIS — M205X1 Other deformities of toe(s) (acquired), right foot: Secondary | ICD-10-CM | POA: Diagnosis not present

## 2022-09-01 DIAGNOSIS — G5761 Lesion of plantar nerve, right lower limb: Secondary | ICD-10-CM | POA: Diagnosis not present

## 2022-09-01 DIAGNOSIS — M2022 Hallux rigidus, left foot: Secondary | ICD-10-CM | POA: Diagnosis not present

## 2022-09-25 ENCOUNTER — Telehealth: Payer: Self-pay | Admitting: Cardiology

## 2022-09-25 NOTE — Telephone Encounter (Signed)
Patient stated that his CPAP machine supplier has closed and he will need future order sent to his new [place] [facility], [phone].

## 2022-09-25 NOTE — Telephone Encounter (Signed)
Spoke with patient to inform him that his message will be sent to [facility].

## 2022-10-06 DIAGNOSIS — G5761 Lesion of plantar nerve, right lower limb: Secondary | ICD-10-CM | POA: Diagnosis not present

## 2022-10-06 DIAGNOSIS — G5762 Lesion of plantar nerve, left lower limb: Secondary | ICD-10-CM | POA: Diagnosis not present

## 2022-10-06 NOTE — Telephone Encounter (Signed)
Order and records faxed to [facility] for patient CPAP therapy. Transferring from [place].

## 2022-11-03 DIAGNOSIS — R6883 Chills (without fever): Secondary | ICD-10-CM | POA: Diagnosis not present

## 2022-12-08 DIAGNOSIS — G5761 Lesion of plantar nerve, right lower limb: Secondary | ICD-10-CM | POA: Diagnosis not present

## 2022-12-09 DIAGNOSIS — R6883 Chills (without fever): Secondary | ICD-10-CM | POA: Diagnosis not present

## 2022-12-09 DIAGNOSIS — M791 Myalgia, unspecified site: Secondary | ICD-10-CM | POA: Diagnosis not present

## 2022-12-17 ENCOUNTER — Other Ambulatory Visit: Payer: Self-pay

## 2022-12-17 ENCOUNTER — Ambulatory Visit: Payer: Federal, State, Local not specified - PPO | Admitting: Internal Medicine

## 2022-12-17 ENCOUNTER — Encounter: Payer: Self-pay | Admitting: Internal Medicine

## 2022-12-17 VITALS — BP 145/94 | HR 76 | Temp 98.3°F | Ht 70.0 in | Wt 243.0 lb

## 2022-12-17 DIAGNOSIS — R6883 Chills (without fever): Secondary | ICD-10-CM | POA: Diagnosis not present

## 2022-12-17 NOTE — Progress Notes (Signed)
Patient: Anthony Golden  DOB: May 02, 1966 MRN: [id number] PCP: Donald Prose, MD      Patient Active Problem List   Diagnosis Date Noted   Chest pain of uncertain etiology [id number]   Essential hypertension 09/14/2019   Dyslipidemia 09/14/2019   Elevated coronary artery calcium score 09/14/2019   Educated about COVID-19 virus infection 09/14/2019   MDD (major depressive disorder) 06/08/2019     Subjective:  VIRGIL BRANDENBURG is a 57 year old male with medical history of hypertension OSA does not use CPAP referred by PCP Dr. Joaquim Lai for positive [facility] spotted fever IgG.  Patient had an excessive rheumatological workup as part of chills without fever.  Labs as below 3/19: ANA negative, RF normal, HIV antibody screen nonreactive, CPK 114, normal iron 106 normal EBV antibody VCA IgM negative, antibody VCA IgG 94.9, positive EBV nuclear antigen antibody IgG 123 positive.  Which is in concordance with past infection.  Per documentation Dr. Has discussed no clear etiology of his symptoms.  Autoimmune workup has been benign.  Referred to infectious disease as noted positive IgG for spotted fever. On review of Dr. Rosita Fire note.  Patient has had been having sensation groups pounds overhead with history of intermittent chills.  The sensation lasts for 10 to 20 minutes at a time, he has remained afebrile no other symptoms.  He had a history of a chronic rash on his abdomen for almost 3 years biopsy with Derm was inconclusive.  It was decided this was intrinsic eczema and treated with prednisone and topical creams.  Rash improved.  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  There is a note stating that he case was reported [facility] but they did not contact them back.  Regardless treated with doxycycline.  No change in treatment.  Review of Systems  All other systems reviewed and are negative.   Past Medical History:   Diagnosis Date   Hypertension    Sleep apnea    Does not use CPAP    Outpatient Medications Prior to Visit  Medication Sig Dispense Refill   fluticasone (FLONASE) 50 MCG/ACT nasal spray Place 1 spray into both nostrils daily as needed for allergies or rhinitis.     lisinopril (ZESTRIL) 20 MG tablet Take 20 mg by mouth daily.     pravastatin (PRAVACHOL) 40 MG tablet TAKE 1 TABLET(40 MG) BY MOUTH EVERY EVENING 30 tablet 6   No facility-administered medications prior to visit.     Allergies  Allergen Reactions   Bee Venom Hives    Social History   Tobacco Use   Smoking status: Never   Smokeless tobacco: Never  Vaping Use   Vaping Use: Never used  Substance Use Topics   Alcohol use: Yes    Comment: Pt report occasianly drinking 1/2 beers per month   Drug use: No    Family History  Problem Relation Age of Onset   Cancer Mother        Lung cancer and colon cancer   COPD Mother    Heart disease Father        Died suddenly age 1.      Objective:  There were no vitals filed for this visit. There is no height or weight on file to calculate BMI.  Physical Exam Constitutional:      General: He is not in acute distress.    Appearance: He is normal weight. He is not toxic-appearing.  HENT:     Head: Normocephalic and atraumatic.     Right Ear: External ear normal.     Left Ear: External ear normal.     Nose: No congestion or rhinorrhea.     Mouth/Throat:     Mouth: Mucous membranes are moist.     Pharynx: Oropharynx is clear.  Eyes:     Extraocular Movements: Extraocular movements intact.     Conjunctiva/sclera: Conjunctivae normal.     Pupils: Pupils are equal, round, and reactive to light.  Cardiovascular:     Rate and Rhythm: Normal rate and regular rhythm.     Heart sounds: No murmur heard.    No friction rub. No gallop.  Pulmonary:     Effort: Pulmonary effort is normal.     Breath sounds: Normal breath sounds.  Abdominal:     General: Abdomen is flat.  Bowel sounds are normal.     Palpations: Abdomen is soft.  Musculoskeletal:        General: No swelling. Normal range of motion.     Cervical back: Normal range of motion and neck supple.  Skin:    General: Skin is warm and dry.  Neurological:     General: No focal deficit present.     Mental Status: He is oriented to person, place, and time.  Psychiatric:        Mood and Affect: Mood normal.     Lab Results: Lab Results  Component Value Date   WBC 8.1 06/08/2019   HGB 15.8 06/08/2019   HCT 46.2 06/08/2019   MCV 94.7 06/08/2019   PLT 269 06/08/2019    Lab Results  Component Value Date   CREATININE 0.68 (L) 09/05/2019   BUN 13 09/05/2019   NA 136 09/05/2019   K 4.3 09/05/2019   CL 99 09/05/2019   CO2 22 09/05/2019    Lab Results  Component Value Date   ALT 104 (H) 06/10/2019   AST 86 (H) 06/10/2019   ALKPHOS 59 06/10/2019   BILITOT 2.5 (H) 06/10/2019     Assessment & Plan:  -Chills x 4 months, about 15 x/day, last about 10 seconds. -No change in diet, weight change, no change in topical medsNo rash with chills just get goosebumps. 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 No relevant travel -Drink well water, no GI issues -Completed 10 days of doxycycline about a month ago, after IgG was found positive. - Patient reports that he lives with a partner at home.  States that could be possible stress/sleep feels stressed when she is "stressed out as she had close advanced position at work. Plan Single positive IgG titer does not confirm the diagnosis/.  IgG antibody titers) for many years.  - We discussed counseling, patient has scheduled for insurance/plans to reach out to employment assistant program. - Follow-up with infectious disease. Laurice Record, MD [facility]   12/17/22  8:12 AM   I have personally spent 62 minutes involved in face-to-face and non-face-to-face activities for this patient on the day of  the visit. Professional time spent includes the following activities: Preparing to see the patient (review of tests), Obtaining and/or reviewing separately obtained history (admission/discharge record), Performing a medically appropriate examination and/or evaluation , Ordering medications/tests/procedures, referring and communicating with other health care professionals, Documenting clinical information in the EMR, Independently interpreting results (not separately reported), Communicating results to the patient/family/caregiver, Counseling and educating the patient/family/caregiver and Care coordination (not  separately reported).

## 2023-01-13 ENCOUNTER — Other Ambulatory Visit: Payer: Self-pay | Admitting: Cardiovascular Disease

## 2023-01-26 DIAGNOSIS — M170 Bilateral primary osteoarthritis of knee: Secondary | ICD-10-CM | POA: Diagnosis not present

## 2023-01-26 DIAGNOSIS — M25562 Pain in left knee: Secondary | ICD-10-CM | POA: Diagnosis not present

## 2023-01-26 DIAGNOSIS — M25561 Pain in right knee: Secondary | ICD-10-CM | POA: Diagnosis not present

## 2023-01-26 DIAGNOSIS — G8929 Other chronic pain: Secondary | ICD-10-CM | POA: Diagnosis not present

## 2023-01-27 ENCOUNTER — Other Ambulatory Visit: Payer: Self-pay | Admitting: Orthopedic Surgery

## 2023-01-27 DIAGNOSIS — M25561 Pain in right knee: Secondary | ICD-10-CM

## 2023-01-28 ENCOUNTER — Telehealth: Payer: Self-pay | Admitting: Cardiology

## 2023-01-28 NOTE — Telephone Encounter (Signed)
Addition to previous note - Patient has appointment scheduled on 5/10.

## 2023-01-28 NOTE — Telephone Encounter (Signed)
*  STAT* If patient is at the pharmacy, call can be transferred to refill team.   1. Which medications need to be refilled? (please list name of each medication and dose if known)   pravastatin (PRAVACHOL) 40 MG tablet   2. Which pharmacy/location (including street and city if local pharmacy) is medication to be sent to?  [id number] - [place], [place] - [address] AT [facility] OF [address]   3. Do they need a 30 day or 90 day supply?   90 day  Patient stated he still has some medication left.

## 2023-01-29 MED ORDER — PRAVASTATIN SODIUM 40 MG PO TABS: 40.0000 mg | ORAL_TABLET | Freq: Every day | ORAL | 0 refills | Status: DC

## 2023-01-29 NOTE — Telephone Encounter (Signed)
The patient was notified that his refill was sent to the pharmacy.

## 2023-02-04 DIAGNOSIS — R072 Precordial pain: Secondary | ICD-10-CM | POA: Insufficient documentation

## 2023-02-04 NOTE — Progress Notes (Signed)
  Cardiology Office Note:   Date:  02/05/2023  ID:  Anthony Golden, DOB [birth year], MRN [id number]  History of Present Illness:   Anthony Golden is a 57 y.o. male  who was referred by Deatra Carrah Eppolito, MD for evaluation of chest pain.   When I saw him in 2019 he had an elevated coronary calcium and he had a negative POET (Plain Old Exercise Treadmill).  He returns for follow-up.    He returns for follow-up and he is having some vague symptoms of a discomfort in the top of his back episodically.  It is when he has cold chills.  He had this worked up and there is been no clear etiology.  He is also had continued fatigue.  He tries to wear CPAP but it comes off quite a bit.  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  He is not bringing on any chest pressure, neck or arm discomfort.  He is not having any new shortness of breath, PND or orthopnea.  He has not had any weight gain or edema.    He has progressive fatigue.  ROS: As stated in the HPI and negative for all other systems.  Studies Reviewed:    EKG: Sinus rhythm, rate 90, left axis deviation, left anterior fascicular block, no acute ST-T wave changes.   Risk Assessment/Calculations:              Physical Exam:   VS:  BP 108/74   Pulse 90   Ht 5\' 10"  (1.778 m)   Wt 241 lb 9.6 oz (109.6 kg)   SpO2 96%   BMI 34.67 kg/m    Wt Readings from Last 3 Encounters:  02/05/23 241 lb 9.6 oz (109.6 kg)  12/17/22 243 lb (110.2 kg)  08/05/21 245 lb (111.1 kg)     GEN: Well nourished, well developed in no acute distress NECK: No JVD; No carotid bruits CARDIAC: RRR, no murmurs, rubs, gallops RESPIRATORY:  Clear to auscultation without rales, wheezing or rhonchi  ABDOMEN: Soft, non-tender, non-distended EXTREMITIES:  No edema; No deformity   ASSESSMENT AND PLAN:   CHEST PAIN: He is not having any chest discomfort but he has a vague discomfort in his back.  I am having the low threshold to screen him given his coronary calcium.  I will have him come back for a POET  (Plain Old Exercise Treadmill)    HTN:  The blood pressure is at target.  No change in therapy.   ELEVATED CORONARY CALCIUM:   I am going to manage this with aggressive risk reduction and evaluate as above.  DYSLIPIDEMIA:   I do not have the most recent LDL.  I like his LDL to be in the 50s and I will get these results from his primary provider.  SLEEP STUDY:  He is being treated for sleep apnea.           Signed, Rollene Rotunda, MD

## 2023-02-05 ENCOUNTER — Encounter: Payer: Self-pay | Admitting: Cardiology

## 2023-02-05 ENCOUNTER — Ambulatory Visit: Payer: Federal, State, Local not specified - PPO | Attending: Cardiology | Admitting: Cardiology

## 2023-02-05 VITALS — BP 108/74 | HR 90 | Ht 70.0 in | Wt 241.6 lb

## 2023-02-05 DIAGNOSIS — R072 Precordial pain: Secondary | ICD-10-CM

## 2023-02-05 DIAGNOSIS — R931 Abnormal findings on diagnostic imaging of heart and coronary circulation: Secondary | ICD-10-CM | POA: Diagnosis not present

## 2023-02-05 DIAGNOSIS — E785 Hyperlipidemia, unspecified: Secondary | ICD-10-CM

## 2023-02-05 DIAGNOSIS — I10 Essential (primary) hypertension: Secondary | ICD-10-CM

## 2023-02-05 NOTE — Patient Instructions (Signed)
    Testing/Procedures:  Your physician has requested that you have an exercise tolerance test. For further information please visit [email or web address] Please also follow instruction sheet, as given. [address]   Follow-Up: At [facility], you and your health needs are our priority.  As part of our continuing mission to provide you with exceptional heart care, we have created designated Provider Care Teams.  These Care Teams include your primary Cardiologist (physician) and Advanced Practice Providers (APPs -  Physician Assistants and Nurse Practitioners) who all work together to provide you with the care you need, when you need it.  We recommend signing up for the patient portal called "MyChart".  Sign up information is provided on this After Visit Summary.  MyChart is used to connect with patients for Virtual Visits (Telemedicine).  Patients are able to view lab/test results, encounter notes, upcoming appointments, etc.  Non-urgent messages can be sent to your provider as well.   To learn more about what you can do with MyChart, go to [email or web address].    Your next appointment:   2 year(s)  Provider:   Rollene Rotunda, MD

## 2023-02-10 ENCOUNTER — Encounter (HOSPITAL_BASED_OUTPATIENT_CLINIC_OR_DEPARTMENT_OTHER): Payer: Self-pay | Admitting: Emergency Medicine

## 2023-02-10 ENCOUNTER — Emergency Department (HOSPITAL_BASED_OUTPATIENT_CLINIC_OR_DEPARTMENT_OTHER): Payer: Federal, State, Local not specified - PPO

## 2023-02-10 ENCOUNTER — Emergency Department (HOSPITAL_BASED_OUTPATIENT_CLINIC_OR_DEPARTMENT_OTHER): Payer: Federal, State, Local not specified - PPO | Admitting: Radiology

## 2023-02-10 ENCOUNTER — Emergency Department (HOSPITAL_BASED_OUTPATIENT_CLINIC_OR_DEPARTMENT_OTHER)
Admission: EM | Admit: 2023-02-10 | Discharge: 2023-02-10 | Disposition: A | Discharge status: Home / Self Care | Payer: Federal, State, Local not specified - PPO | Attending: Emergency Medicine | Admitting: Emergency Medicine

## 2023-02-10 ENCOUNTER — Other Ambulatory Visit: Payer: Self-pay

## 2023-02-10 DIAGNOSIS — S161XXA Strain of muscle, fascia and tendon at neck level, initial encounter: Secondary | ICD-10-CM | POA: Diagnosis not present

## 2023-02-10 DIAGNOSIS — Y92410 Unspecified street and highway as the place of occurrence of the external cause: Secondary | ICD-10-CM | POA: Diagnosis not present

## 2023-02-10 DIAGNOSIS — S39012A Strain of muscle, fascia and tendon of lower back, initial encounter: Secondary | ICD-10-CM | POA: Diagnosis not present

## 2023-02-10 DIAGNOSIS — M8588 Other specified disorders of bone density and structure, other site: Secondary | ICD-10-CM | POA: Diagnosis not present

## 2023-02-10 DIAGNOSIS — R519 Headache, unspecified: Secondary | ICD-10-CM | POA: Diagnosis not present

## 2023-02-10 DIAGNOSIS — M47812 Spondylosis without myelopathy or radiculopathy, cervical region: Secondary | ICD-10-CM | POA: Diagnosis not present

## 2023-02-10 DIAGNOSIS — S199XXA Unspecified injury of neck, initial encounter: Secondary | ICD-10-CM | POA: Diagnosis not present

## 2023-02-10 MED ORDER — IBUPROFEN 400 MG PO TABS
600.0000 mg | ORAL_TABLET | Freq: Once | ORAL | Status: AC
  Administered 2023-02-10: 600 mg via ORAL
  Filled 2023-02-10: qty 1

## 2023-02-10 MED ORDER — CYCLOBENZAPRINE HCL 10 MG PO TABS: 10.0000 mg | ORAL_TABLET | Freq: Two times a day (BID) | ORAL | 0 refills | Status: DC | PRN
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
## 2023-02-10 NOTE — Discharge Instructions (Signed)
Follow-up with your orthopedist if your symptoms are not improving.  Return to the emergency room if you have any worsening symptoms.

## 2023-02-11 ENCOUNTER — Inpatient Hospital Stay: Admission: RE | Admit: 2023-02-11 | Payer: Federal, State, Local not specified - PPO | Source: Ambulatory Visit

## 2023-02-12 DIAGNOSIS — M542 Cervicalgia: Secondary | ICD-10-CM | POA: Diagnosis not present

## 2023-02-12 DIAGNOSIS — G894 Chronic pain syndrome: Secondary | ICD-10-CM | POA: Diagnosis not present

## 2023-02-12 DIAGNOSIS — M545 Low back pain, unspecified: Secondary | ICD-10-CM | POA: Diagnosis not present

## 2023-02-25 ENCOUNTER — Ambulatory Visit: Payer: Federal, State, Local not specified - PPO | Attending: Cardiology

## 2023-02-25 DIAGNOSIS — R072 Precordial pain: Secondary | ICD-10-CM

## 2023-02-25 LAB — EXERCISE TOLERANCE TEST
Angina Index: 0
Duke Treadmill Score: 9
Estimated workload: 10.1
Exercise duration (min): 9 min
Exercise duration (sec): 0 s
MPHR: 161 {beats}/min
Peak HR: 142 {beats}/min
Percent HR: 88 %
RPE: 15
Rest HR: 81 {beats}/min
ST Depression (mm): 0 mm

## 2023-03-01 ENCOUNTER — Encounter: Payer: Self-pay | Admitting: *Deleted

## 2023-03-02 DIAGNOSIS — G894 Chronic pain syndrome: Secondary | ICD-10-CM | POA: Diagnosis not present

## 2023-03-02 DIAGNOSIS — M542 Cervicalgia: Secondary | ICD-10-CM | POA: Diagnosis not present

## 2023-03-02 DIAGNOSIS — M545 Low back pain, unspecified: Secondary | ICD-10-CM | POA: Diagnosis not present

## 2023-03-16 DIAGNOSIS — M545 Low back pain, unspecified: Secondary | ICD-10-CM | POA: Diagnosis not present

## 2023-03-16 DIAGNOSIS — M542 Cervicalgia: Secondary | ICD-10-CM | POA: Diagnosis not present

## 2023-03-16 DIAGNOSIS — G894 Chronic pain syndrome: Secondary | ICD-10-CM | POA: Diagnosis not present

## 2023-03-27 ENCOUNTER — Other Ambulatory Visit: Payer: Self-pay | Admitting: Cardiology

## 2023-03-29 ENCOUNTER — Other Ambulatory Visit: Payer: Self-pay | Admitting: Cardiology

## 2023-04-08 DIAGNOSIS — G4733 Obstructive sleep apnea (adult) (pediatric): Secondary | ICD-10-CM | POA: Diagnosis not present

## 2023-04-26 ENCOUNTER — Other Ambulatory Visit (HOSPITAL_COMMUNITY): Payer: Self-pay | Admitting: Neurological Surgery

## 2023-04-26 DIAGNOSIS — M4317 Spondylolisthesis, lumbosacral region: Secondary | ICD-10-CM

## 2023-05-07 ENCOUNTER — Other Ambulatory Visit: Payer: Self-pay | Admitting: Cardiology

## 2023-05-10 DIAGNOSIS — G4733 Obstructive sleep apnea (adult) (pediatric): Secondary | ICD-10-CM | POA: Diagnosis not present

## 2023-05-26 ENCOUNTER — Ambulatory Visit (HOSPITAL_COMMUNITY)
Admission: RE | Admit: 2023-05-26 | Discharge: 2023-05-26 | Disposition: A | Discharge status: Home / Self Care | Source: Ambulatory Visit | Attending: Neurological Surgery | Admitting: Neurological Surgery

## 2023-05-26 DIAGNOSIS — M4317 Spondylolisthesis, lumbosacral region: Secondary | ICD-10-CM | POA: Diagnosis present

## 2023-06-09 DIAGNOSIS — G4733 Obstructive sleep apnea (adult) (pediatric): Secondary | ICD-10-CM | POA: Diagnosis not present

## 2023-06-17 DIAGNOSIS — R748 Abnormal levels of other serum enzymes: Secondary | ICD-10-CM | POA: Diagnosis not present

## 2023-08-31 ENCOUNTER — Telehealth: Payer: Self-pay | Admitting: Cardiology

## 2023-08-31 NOTE — Telephone Encounter (Signed)
Patient walked in today requesting call back to speak with someone about breathing apparatus

## 2023-08-31 NOTE — Telephone Encounter (Signed)
Spoke with pt, he is having trouble with his nasal canula and wants to know if he needs to use the full face mask. Aware will forward to the sleep team to contact him.

## 2023-09-03 NOTE — Telephone Encounter (Signed)
Pt was last seen by Dr. Tresa Endo in 07/2021 and will need a follow-up to discuss alternative mask. Message sent to scheduler and will ask sleep coordinator to make patient aware.

## 2023-09-07 ENCOUNTER — Ambulatory Visit: Payer: Federal, State, Local not specified - PPO | Attending: Cardiovascular Disease | Admitting: Cardiovascular Disease

## 2023-09-07 ENCOUNTER — Encounter: Payer: Self-pay | Admitting: Cardiovascular Disease

## 2023-09-07 VITALS — BP 144/94 | HR 84 | Ht 70.0 in | Wt 245.0 lb

## 2023-09-07 DIAGNOSIS — R931 Abnormal findings on diagnostic imaging of heart and coronary circulation: Secondary | ICD-10-CM | POA: Diagnosis not present

## 2023-09-07 DIAGNOSIS — I10 Essential (primary) hypertension: Secondary | ICD-10-CM

## 2023-09-07 DIAGNOSIS — G4719 Other hypersomnia: Secondary | ICD-10-CM | POA: Diagnosis not present

## 2023-09-07 DIAGNOSIS — E66812 Obesity, class 2: Secondary | ICD-10-CM

## 2023-09-07 DIAGNOSIS — G4733 Obstructive sleep apnea (adult) (pediatric): Secondary | ICD-10-CM | POA: Diagnosis not present

## 2023-09-07 DIAGNOSIS — E785 Hyperlipidemia, unspecified: Secondary | ICD-10-CM

## 2023-09-07 NOTE — Progress Notes (Unsigned)
Cardiology Office Note    Date:  09/07/2023   ID:  Anthony Golden, Anthony Golden 1966/02/14, MRN [id number]  PCP:  Deatra James, MD  Cardiologist:  Nicki Guadalajara, MD (sleep); Dr. 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 Antoine Poche for primary cardiology care.  He was recently found to have significant sleep apnea and has initiated CPAP therapy.  I saw him for initial evaluation in October 25, 2019.  He presents for a 21 month follow-up evaluation.  Anthony Golden is originally from [place], [place].  He has a history of previously documented sleep apnea  reportedly over 10 years ago had a sleep study and was on CPAP therapy.  He apparently stopped using therapy and has not been on treatment for over 10 years.  Following a divorce, he moved  from [place] to [facility].  He had developed a worsening cough leading to an ER evaluation on July 26, 2019 for suspected Covid.  At that time he did have some shortness of breath.  A chest x-ray did not show any acute process.  He was felt to have a mild course of infection and was not hospitalized with self-isolation at home.  He has seen Dr. Antoine Poche for a chest pain and had undergone a calcium score which was increased at 232, representing 91 percentile for age and sex matched control.  With evidence for calcium in the proximal and mid LAD and distal RCA with significant mitral annular calcification.  He subsequently had a routine treadmill test.   Due to recent progressive symptoms of sleep apnea manifested as fatigue, daytime sleepiness, nonrestorative sleep, and snoring, he was referred for a sleep study which was done on August 29, 2019.  This revealed severe obstructive sleep apnea in a split-night protocol with overall AHI 81.4/h.  He had severe oxygen desaturation to a nadir of 66% during the diagnostic portion of the study.  CPAP was initiated and was  titrated up to 12 cm water pressure with excellent benefit.  He has subsequently been on CPAP therapy since his set up date on October 12, 2019.  Since initiating therapy, he has been 100% compliant and has been averaging 5 hours and 15 minutes of CPAP use per night.  AHI is 0.8.  He has been using a Respironics nasal pro gel medium size mask but was recently switched to a ResMed N 20 large mask which he prefers.  Since initiating CPAP therapy, he has noticed dramatic benefit and is now sleeping significantly better.  His nocturia has significantly improved.  His sleep is restorative.  He denies any residual daytime sleepiness.  An Epworth Sleepiness Scale score was calculated in the office  and this endorsed at 3 as shown below:  Epworth Sleepiness Scale: Situation   Chance of Dozing/Sleeping (0 = never , 1 = slight chance , 2 = moderate chance , 3 = high chance )   sitting and reading 0   watching TV 1   sitting inactive in a public place 0   being a passenger in a motor vehicle for an hour or more 1   lying down in the afternoon 1   sitting and talking to someone 0   sitting quietly after lunch (no alcohol) 0   while stopped for a few minutes in traffic as the driver 0   Total Score  3  When I initially saw him he felt significantly improved with treatment of residual snoring,  restless legs, bruxism, hypnagogic hallucinations or cataplectic events.  He was no longer having morning headaches, sleep is restorative and there was resolution of prior snoring.  He also noted significant improvement in sleep apnea related nocturia.  Since I last saw him, he has continued to use CPAP therapy.  At times he has had some difficulty with his mask  He received a new mask which is an Building control surveyor  F 20 with improvement.  I obtained a download from October 9 through August 04, 2021.  Usage is 100%.  There is minimal if any mask leak 2 nights.  He has been set at a pressure of 12 cm and AHI is 4.2.  He believes  he is sleeping well.  He is unaware of breakthrough snoring.  He presents for evaluation.   Past Medical History:  Diagnosis Date   Hypertension    Sleep apnea    Does not use CPAP    Past Surgical History:  Procedure Laterality Date   TMJ ARTHROSCOPY      Current Medications: Outpatient Medications Prior to Visit  Medication Sig Dispense Refill   lisinopril (ZESTRIL) 20 MG tablet Take 20 mg by mouth daily.     pravastatin (PRAVACHOL) 40 MG tablet TAKE 1 TABLET(40 MG) BY MOUTH DAILY 30 tablet 9   cyclobenzaprine (FLEXERIL) 10 MG tablet Take 1 tablet (10 mg total) by mouth 2 (two) times daily as needed for muscle spasms. 20 tablet 0   fluticasone (FLONASE) 50 MCG/ACT nasal spray Place 1 spray into both nostrils daily as needed for allergies or rhinitis.     No facility-administered medications prior to visit.     Allergies:   Bee venom   Social History   Socioeconomic History   Marital status: Single    Spouse name: Not on file   Number of children: Not on file   Years of education: Not on file   Highest education level: Not on file  Occupational History   Not on file  Tobacco Use   Smoking status: Never   Smokeless tobacco: Never  Vaping Use   Vaping status: Never Used  Substance and Sexual Activity   Alcohol use: Yes    Comment: Pt report occasianly drinking 1/2 beers per month   Drug use: No   Sexual activity: Not Currently  Other Topics Concern   Not on file  Social History Narrative   Not on file   Social Determinants of Health   Financial Resource Strain: Not on file  Food Insecurity: Not on file  Transportation Needs: Not on file  Physical Activity: Not on file  Stress: Not on file  Social Connections: Unknown (02/10/2022)   Received from [facility], [facility]   Social Network    Social Network: Not on file     Social history is notable that he is divorced.  He has no children.  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  No tobacco  history.  Family History:  The patient's family history includes COPD in his mother; Cancer in his mother; Heart disease in his father.  His mother is alive at age 39.  Father died at age 42 and had congestive heart failure.  He has 4 brothers, 1 with obstructive sleep apnea.  He has 1 sister.  ROS General: Negative; No fevers, chills, or night sweats; obesity HEENT: Negative; No changes in vision or hearing,  sinus congestion, difficulty swallowing Pulmonary: Negative; No cough, wheezing, shortness of breath, hemoptysis Cardiovascular: Coronary calcification on CT, history of hypertension and hyperlipidemia GI: Negative; No nausea, vomiting, diarrhea, or abdominal pain GU: Negative; No dysuria, hematuria, or difficulty voiding Musculoskeletal: Negative; no myalgias, joint pain, or weakness Hematologic/Oncology: Negative; no easy bruising, bleeding Endocrine: Negative; no heat/cold intolerance; no diabetes Neuro: Negative; no changes in balance, headaches Skin: Negative; No rashes or skin lesions Psychiatric: Negative; No behavioral problems, depression Sleep: See HPI Other comprehensive 14 point system review is negative.   PHYSICAL EXAM:   VS:  BP (!) 144/94   Pulse 84   Ht 5\' 10"  (1.778 m)   Wt 245 lb (111.1 kg)   SpO2 97%   BMI 35.15 kg/m     Repeat blood pressure by me 130/76  Wt Readings from Last 3 Encounters:  09/07/23 245 lb (111.1 kg)  02/10/23 238 lb (108 kg)  02/05/23 241 lb 9.6 oz (109.6 kg)    General: Alert, oriented, no distress.  Skin: normal turgor, no rashes, warm and dry HEENT: Normocephalic, atraumatic. Pupils equal round and reactive to light; sclera anicteric; extraocular muscles intact;  Nose without nasal septal hypertrophy Mouth/Parynx benign; Mallinpatti scale 3 Neck: No JVD, no carotid bruits; normal carotid upstroke Lungs: clear to ausculatation and percussion; no wheezing or rales Chest wall: without tenderness to palpitation Heart: PMI not  displaced, RRR, s1 s2 normal, 1/6 systolic murmur, no diastolic murmur, no rubs, gallops, thrills, or heaves Abdomen: soft, nontender; no hepatosplenomehaly, BS+; abdominal aorta nontender and not dilated by palpation. Back: no CVA tenderness Pulses 2+ Musculoskeletal: full range of motion, normal strength, no joint deformities Extremities: no clubbing cyanosis or edema, Homan's sign negative  Neurologic: grossly nonfocal; Cranial nerves grossly wnl Psychologic: Normal mood and affect   Studies/Labs Reviewed:  EKG Interpretation Date/Time:  Tuesday September 07 2023 10:21:33 EST Ventricular Rate:  84 PR Interval:  138 QRS Duration:  88 QT Interval:  394 QTC Calculation: 465 R Axis:   -56  Text Interpretation: Normal sinus rhythm Left axis deviation When compared with ECG of 10-Jun-2019 15:38, QRS axis Shifted right T wave inversion no longer evident in Anterior leads Confirmed by Nicki Guadalajara ([id number]) on 09/07/2023 11:21:32 AM    August 05, 2021 ECG (independently read by me): NSR at 89, LAHB, QTc 474 msec  October 25, 2019 ECG (independently read by me): NSR at 94; LAFB, QTc 472 msec; no ectopy  Recent Labs:    Latest Ref Rng & Units 09/05/2019    8:57 AM 06/08/2019    6:37 PM  BMP  Glucose 65 - 99 mg/dL 91  94   BUN 6 - 24 mg/dL 13  11   Creatinine 2.95 - 1.27 mg/dL 6.21  3.08   BUN/Creat Ratio 9 - 20 19    Sodium 134 - 144 mmol/L 136  136   Potassium 3.5 - 5.2 mmol/L 4.3  3.8   Chloride 96 - 106 mmol/L 99  99   CO2 20 - 29 mmol/L 22  23   Calcium 8.7 - 10.2 mg/dL 9.8  9.0         Latest Ref Rng & Units 06/10/2019    6:40 AM 06/08/2019    6:37 PM  Hepatic Function  Total Protein 6.5 - 8.1 g/dL 7.7  7.2   Albumin 3.5 - 5.0 g/dL 4.2  4.1   AST 15 - 41 U/L 86  188   ALT 0 - 44 U/L 104  146   Alk Phosphatase 38 - 126 U/L 59  55   Total Bilirubin 0.3 - 1.2 mg/dL 2.5  1.2   Bilirubin, Direct 0.0 - 0.2 mg/dL 0.5         Latest Ref Rng & Units 06/08/2019    6:37 PM   CBC  WBC 4.0 - 10.5 K/uL 8.1   Hemoglobin 13.0 - 17.0 g/dL 02.7   Hematocrit 25.3 - 52.0 % 46.2   Platelets 150 - 400 K/uL 269    Lab Results  Component Value Date   MCV 94.7 06/08/2019   Lab Results  Component Value Date   TSH 3.599 06/08/2019   Lab Results  Component Value Date   HGBA1C 5.4 06/08/2019     BNP    Component Value Date/Time   BNP 31.4 06/08/2019 1837    ProBNP No results found for: "PROBNP"   Lipid Panel     Component Value Date/Time   CHOL 185 09/05/2019 0857   TRIG 268 (H) 09/05/2019 0857   HDL 42 09/05/2019 0857   CHOLHDL 4.4 09/05/2019 0857   CHOLHDL 5.4 06/08/2019 1837   VLDL UNABLE TO CALCULATE IF TRIGLYCERIDE OVER 400 mg/dL 66/44/0347 4259   LDLCALC 98 09/05/2019 0857   LDLDIRECT 30.0 06/08/2019 1837   LABVLDL 45 (H) 09/05/2019 0857     RADIOLOGY: No results found.   Additional studies/ records that were reviewed today include:  I reviewed the patient's ER Covid evaluation, evaluation of Dr. Antoine Poche, his coronary calcium CT score, stress test, as well as split-night sleep study and obtained a download since CPAP initiation and recalculated an Epworth Sleepiness Scale score.  And a download was obtained from October 9 through August 04, 2021.   ASSESSMENT:    1. Essential hypertension   2. Elevated coronary artery calcium score     PLAN:  Anthony Golden is a 13 -year-old gentleman who reportedly was diagnosed with sleep apnea well over 12 years ago and had used CPAP for short duration.  He had not been on therapy for over 10 years.  He has a history of hypertension, obesity, hyperlipidemia, and is status post a recent mild Covid infection for which he stayed at home following an ER evaluation.  He has been documented to have coronary calcification and coronary calcium score is increased at 232, representing 91st percentile for age and sex matched control.  A routine treadmill test was negative for ischemia.  His split-night  sleep evaluation  on September 18, 2019 demonstrated very severe sleep apnea with an AHI of 81.4/h on the diagnostic portion of the study with severe oxygen desaturation to a nadir of 66%.  CPAP was initiated and titrated to optimal pressure 12 cm.  He was significantly symptomatic prior to initiating CPAP therapy.  When I saw him for my initial evaluation with me in January 2021 he has been CPAP therapy since October 12, 2019 and his download since initiation confirms 100% compliance with excellent AHI at 0.2/h. At my initial sleep evaluation, I had a long discussion with him and reviewed normal sleep architecture and the effects of sleep apnea and sleep architecture disruption.  I specifically discussed his severe hypoxemia at night as a potential cause of significant nocturnal arrhythmias as well as potential coronary as well as cerebrovascular ischemia.  I discussed implications of untreated sleep apnea particularly with reference to blood pressure, atrial fibrillation, glucose intolerance, inflammation as well as GERD.  Since initiating CPAP his symptoms have dramatically improved.  He no longer is having morning headaches.  His sleep is restored.  There is no daytime sleepiness and there is resolution of prior significant snoring.  I discussed the physiology associated with significant sleep apnea related nocturia.  We discussed optimal sleep duration with CPAP therapy ideally at 8 hours per night.  He is moderately obese.  We discussed the importance of weight loss and exercise both on his cardiovascular health as well as its effects on sleep apnea.  Since I last saw him, he has continued to use CPAP with excellent compliance.  On his most recent download from October 9 through August 04, 2021 although usage was 100% average use was only 5 hours of 32 minutes.  I again discussed optimal sleep duration for an adult between 7 and 9 hours.  His current setting is set at a pressure of 12 cm and AHI of 4.2.  He is  doing better with his new AirTouch F 20 mask.  Presently, I have recommended adjustment to his CPAP parameters.  I will change his ramp start pressure to 6 from 4.  I will also changing from a set pressure of 12 cm to an auto pressure range of 12 to 18 cm of water.  We discussed the importance of continued weight loss and exercise.  He has previously been noted to have coronary calcification and continued aggressive lifestyle adjustment with optimal lipid therapy is essential in attempt to induce plaque stability and hopeful regression.  He continues to be on lisinopril 20 mg for hypetension and pravastatin 40 mg for hyperlipidemia he will follow-up with Dr. Antoine Poche for his primary cardiology care.  I will see him in 1 year for sleep reevaluation or sooner as needed.    Medication Adjustments/Labs and Tests Ordered: Current medicines are reviewed at length with the patient today.  Concerns regarding medicines are outlined above.  Medication changes, Labs and Tests ordered today are listed in the Patient Instructions below. There are no Patient Instructions on file for this visit.   Signed, Nicki Guadalajara, MD, [facility], ABSM Diplomate, [employer] Board of Sleep Medicine  09/07/2023 10:49 AM    [facility] [address], [place], [place]  [address] Phone: [phone]

## 2023-09-07 NOTE — Progress Notes (Unsigned)
Patient given F20 Mask in clinic to try. Prescription sent to [facility] for PAP order

## 2023-09-07 NOTE — Patient Instructions (Signed)
Medication Instructions:  *If you need a refill on your cardiac medications before your next appointment, please call your pharmacy*   Lab Work: If you have labs (blood work) drawn today and your tests are completely normal, you will receive your results only by: MyChart Message (if you have MyChart) OR A paper copy in the mail If you have any lab test that is abnormal or we need to change your treatment, we will call you to review the results.  Follow-Up: At [facility], you and your health needs are our priority.  As part of our continuing mission to provide you with exceptional heart care, we have created designated Provider Care Teams.  These Care Teams include your primary Cardiologist (physician) and Advanced Practice Providers (APPs -  Physician Assistants and Nurse Practitioners) who all work together to provide you with the care you need, when you need it.  We recommend signing up for the patient portal called "MyChart".  Sign up information is provided on this After Visit Summary.  MyChart is used to connect with patients for Virtual Visits (Telemedicine).  Patients are able to view lab/test results, encounter notes, upcoming appointments, etc.  Non-urgent messages can be sent to your provider as well.   To learn more about what you can do with MyChart, go to [email or web address].    Your next appointment:    As needed for sleep  Provider:   Dr. Nicki Guadalajara     Other Instructions If you have any questions or concerns regarding your c-pap, bi-pap or sleep accessories, please contact Brandie Rorie at [phone].

## 2023-09-14 DIAGNOSIS — I10 Essential (primary) hypertension: Secondary | ICD-10-CM | POA: Diagnosis not present

## 2023-09-14 DIAGNOSIS — Z03818 Encounter for observation for suspected exposure to other biological agents ruled out: Secondary | ICD-10-CM | POA: Diagnosis not present

## 2023-09-14 DIAGNOSIS — B349 Viral infection, unspecified: Secondary | ICD-10-CM | POA: Diagnosis not present

## 2023-10-14 ENCOUNTER — Other Ambulatory Visit: Payer: Self-pay | Admitting: Neurological Surgery

## 2023-11-02 ENCOUNTER — Encounter (HOSPITAL_COMMUNITY): Payer: Self-pay

## 2023-11-02 NOTE — Progress Notes (Signed)
 Surgical Instructions   Your procedure is scheduled on Friday, 11/05/23.SABRA Report to [facility] Main Entrance A at 10:15 A.M., then check in with the Admitting office. Any questions or running late day of surgery: call [phone]  Questions prior to your surgery date: call [phone], Monday-Friday, 8am-4pm. If you experience any cold or flu symptoms such as cough, fever, chills, shortness of breath, etc. between now and your scheduled surgery, please notify us  at the above number.     Remember:  Do not eat after midnight the night before your surgery-Thursday  You may drink clear liquids until 9:15 AM, the morning of your surgery.   Clear liquids allowed are: Water, Non-Citrus Juices (without pulp), Carbonated Beverages, Clear Tea (no milk, honey, etc.), Black Coffee Only (NO MILK, CREAM OR POWDERED CREAMER of any kind), and Gatorade.    Take these medicines the morning of surgery with A SIP OF WATER: pravastatin  (PRAVACHOL )    May take these medicines IF NEEDED: Ocean Nasal Spray    One week prior to surgery, STOP taking any Aspirin (unless otherwise instructed by your surgeon) Aleve, Naproxen, Ibuprofen , Motrin , Advil , Goody's, BC's, all herbal medications, fish oil, and non-prescription vitamins.                     Do NOT Smoke (Tobacco/Vaping) for 24 hours prior to your procedure.  If you use a CPAP at night, you may bring your mask/headgear for your overnight stay.   You will be asked to remove any contacts, glasses, piercing's, hearing aid's, dentures/partials prior to surgery. Please bring cases for these items if needed.    Patients discharged the day of surgery will not be allowed to drive home, and someone needs to stay with them for 24 hours.  SURGICAL WAITING ROOM VISITATION Patients may have no more than 2 support people in the waiting area - these visitors may rotate.   Pre-op nurse will coordinate an appropriate time for 1 ADULT support person, who may not  rotate, to accompany patient in pre-op.  Children under the age of 82 must have an adult with them who is not the patient and must remain in the main waiting area with an adult.  If the patient needs to stay at the hospital during part of their recovery, the visitor guidelines for inpatient rooms apply.  Please refer to the [facility] website for the visitor guidelines for any additional information.   If you received a COVID test during your pre-op visit  it is requested that you wear a mask when out in public, stay away from anyone that may not be feeling well and notify your surgeon if you develop symptoms. If you have been in contact with anyone that has tested positive in the last 10 days please notify you surgeon.      Pre-operative 5 CHG Bathing Instructions   You can play a key role in reducing the risk of infection after surgery. Your skin needs to be as free of germs as possible. You can reduce the number of germs on your skin by washing with CHG (chlorhexidine  gluconate) soap before surgery. CHG is an antiseptic soap that kills germs and continues to kill germs even after washing.   DO NOT use if you have an allergy to chlorhexidine /CHG or antibacterial soaps. If your skin becomes reddened or irritated, stop using the CHG and notify one of our RNs at [phone].   Please shower with the CHG soap starting 4 days before  surgery using the following schedule:     Please keep in mind the following:  DO NOT shave, including legs and underarms, starting the day of your first shower.   You may shave your face at any point before/day of surgery.  Place clean sheets on your bed the day you start using CHG soap. Use a clean washcloth (not used since being washed) for each shower. DO NOT sleep with pets once you start using the CHG.   CHG Shower Instructions:  Wash your face and private area with normal soap. If you choose to wash your hair, wash first with your normal shampoo.   After you use shampoo/soap, rinse your hair and body thoroughly to remove shampoo/soap residue.  Turn the water OFF and apply about 3 tablespoons (45 ml) of CHG soap to a CLEAN washcloth.  Apply CHG soap ONLY FROM YOUR NECK DOWN TO YOUR TOES (washing for 3-5 minutes)  DO NOT use CHG soap on face, private areas, open wounds, or sores.  Pay special attention to the area where your surgery is being performed.  If you are having back surgery, having someone wash your back for you may be helpful. Wait 2 minutes after CHG soap is applied, then you may rinse off the CHG soap.  Pat dry with a clean towel  Put on clean clothes/pajamas   If you choose to wear lotion, please use ONLY the CHG-compatible lotions that are listed below.  Additional instructions for the day of surgery: DO NOT APPLY any lotions, deodorants, cologne, or perfumes.   Do not bring valuables to the hospital. [facility] is not responsible for any belongings/valuables. Do not wear nail polish, gel polish, artificial nails, or any other type of covering on natural nails (fingers and toes) Do not wear jewelry or makeup Put on clean/comfortable clothes.  Please brush your teeth.  Ask your nurse before applying any prescription medications to the skin.     CHG Compatible Lotions   Aveeno Moisturizing lotion  Cetaphil Moisturizing Cream  Cetaphil Moisturizing Lotion  Clairol Herbal Essence Moisturizing Lotion, Dry Skin  Clairol Herbal Essence Moisturizing Lotion, Extra Dry Skin  Clairol Herbal Essence Moisturizing Lotion, Normal Skin  Curel Age Defying Therapeutic Moisturizing Lotion with Alpha Hydroxy  Curel Extreme Care Body Lotion  Curel Soothing Hands Moisturizing Hand Lotion  Curel Therapeutic Moisturizing Cream, Fragrance-Free  Curel Therapeutic Moisturizing Lotion, Fragrance-Free  Curel Therapeutic Moisturizing Lotion, Original Formula  Eucerin Daily Replenishing Lotion  Eucerin Dry Skin Therapy Plus Alpha Hydroxy  Crme  Eucerin Dry Skin Therapy Plus Alpha Hydroxy Lotion  Eucerin Original Crme  Eucerin Original Lotion  Eucerin Plus Crme Eucerin Plus Lotion  Eucerin TriLipid Replenishing Lotion  Keri Anti-Bacterial Hand Lotion  Keri Deep Conditioning Original Lotion Dry Skin Formula Softly Scented  Keri Deep Conditioning Original Lotion, Fragrance Free Sensitive Skin Formula  Keri Lotion Fast Absorbing Fragrance Free Sensitive Skin Formula  Keri Lotion Fast Absorbing Softly Scented Dry Skin Formula  Keri Original Lotion  Keri Skin Renewal Lotion Keri Silky Smooth Lotion  Keri Silky Smooth Sensitive Skin Lotion  Nivea Body Creamy Conditioning Oil  Nivea Body Extra Enriched Lotion  Nivea Body Original Lotion  Nivea Body Sheer Moisturizing Lotion Nivea Crme  Nivea Skin Firming Lotion  NutraDerm 30 Skin Lotion  NutraDerm Skin Lotion  NutraDerm Therapeutic Skin Cream  NutraDerm Therapeutic Skin Lotion  ProShield Protective Hand Cream  Provon moisturizing lotion  Please read over the following fact sheets that you were given.Surgical Instructions

## 2023-11-02 NOTE — Progress Notes (Signed)
 PCP - Dr Arnett Repress Cardiologist - Dr Lynwood Schilling  Chest x-ray - n/a EKG - 09/07/23 Stress Test - 02/25/23 ECHO - n/a Cardiac Cath - n/a  ICD Pacemaker/Loop - n/a  Sleep Study -  Yes (08/2019) CPAP - uses CPAP nightly  Diabetes - n/a  Aspirin and Blood Thinner Instructions:  n/a  ERAS - Clear liquids til 9:15 AM DOS.  Anesthesia review: No  STOP now taking any Aspirin (unless otherwise instructed by your surgeon), Aleve, Naproxen, Ibuprofen , Motrin , Advil , Goody's, BC's, all herbal medications, fish oil, and all vitamins.   Coronavirus Screening Do you have any of the following symptoms:  Cough yes/no: No Fever (>100.75F)  yes/no: No Runny nose yes/no: No Sore throat yes/no: No Difficulty breathing/shortness of breath  yes/no: No  Have you traveled in the last 14 days and where? yes/no: No  Patient verbalized understanding of instructions that were given to them at the PAT appointment. Patient was also instructed that they will need to review over the PAT instructions again at home before surgery.

## 2023-11-03 ENCOUNTER — Encounter (HOSPITAL_COMMUNITY)
Admission: RE | Admit: 2023-11-03 | Discharge: 2023-11-03 | Disposition: A | Discharge status: Home / Self Care | Source: Ambulatory Visit | Attending: Neurological Surgery | Admitting: Neurological Surgery

## 2023-11-03 ENCOUNTER — Other Ambulatory Visit: Payer: Self-pay

## 2023-11-03 ENCOUNTER — Encounter (HOSPITAL_COMMUNITY): Payer: Self-pay

## 2023-11-03 VITALS — BP 135/90 | HR 85 | Temp 98.4°F | Resp 20 | Ht 70.0 in | Wt 246.7 lb

## 2023-11-03 DIAGNOSIS — Z01818 Encounter for other preprocedural examination: Secondary | ICD-10-CM

## 2023-11-03 DIAGNOSIS — Z01812 Encounter for preprocedural laboratory examination: Secondary | ICD-10-CM | POA: Insufficient documentation

## 2023-11-03 HISTORY — DX: Hyperlipidemia, unspecified: E78.5

## 2023-11-03 HISTORY — DX: Genetic susceptibility to other disease: Z15.89

## 2023-11-03 HISTORY — DX: Other complications of anesthesia, initial encounter: T88.59XA

## 2023-11-03 LAB — SURGICAL PCR SCREEN
MRSA, PCR: NEGATIVE
Staphylococcus aureus: POSITIVE — AB

## 2023-11-03 LAB — TYPE AND SCREEN
ABO/RH(D): O POS
Antibody Screen: NEGATIVE

## 2023-11-03 NOTE — Progress Notes (Signed)
 Need to redraw labs on DOS for CBC, BMP, PT-INR.  Stat orders were placed for DOS in EPIC.  Per Rolm Clos in Lab, the above samples were not good, need to recollect.

## 2023-11-05 ENCOUNTER — Encounter (HOSPITAL_COMMUNITY): Payer: Self-pay | Admitting: Neurological Surgery

## 2023-11-05 ENCOUNTER — Other Ambulatory Visit: Payer: Self-pay

## 2023-11-05 ENCOUNTER — Inpatient Hospital Stay (HOSPITAL_COMMUNITY): Admitting: Anesthesiology

## 2023-11-05 ENCOUNTER — Inpatient Hospital Stay (HOSPITAL_COMMUNITY): Payer: Federal, State, Local not specified - PPO

## 2023-11-05 ENCOUNTER — Inpatient Hospital Stay (HOSPITAL_COMMUNITY)
Admission: RE | Admit: 2023-11-05 | Discharge: 2023-11-06 | DRG: 428 | Disposition: A | Discharge status: Home / Self Care | Attending: Neurological Surgery | Admitting: Neurological Surgery

## 2023-11-05 ENCOUNTER — Encounter (HOSPITAL_COMMUNITY)
Admission: RE | Disposition: A | Discharge status: Home / Self Care | Payer: Self-pay | Source: Home / Self Care | Attending: Neurological Surgery

## 2023-11-05 DIAGNOSIS — M5417 Radiculopathy, lumbosacral region: Secondary | ICD-10-CM | POA: Diagnosis present

## 2023-11-05 DIAGNOSIS — G4730 Sleep apnea, unspecified: Secondary | ICD-10-CM | POA: Diagnosis present

## 2023-11-05 DIAGNOSIS — I10 Essential (primary) hypertension: Secondary | ICD-10-CM

## 2023-11-05 DIAGNOSIS — M48061 Spinal stenosis, lumbar region without neurogenic claudication: Secondary | ICD-10-CM | POA: Diagnosis not present

## 2023-11-05 DIAGNOSIS — M5416 Radiculopathy, lumbar region: Secondary | ICD-10-CM | POA: Diagnosis present

## 2023-11-05 DIAGNOSIS — Z8249 Family history of ischemic heart disease and other diseases of the circulatory system: Secondary | ICD-10-CM

## 2023-11-05 DIAGNOSIS — M4317 Spondylolisthesis, lumbosacral region: Principal | ICD-10-CM | POA: Diagnosis present

## 2023-11-05 DIAGNOSIS — M4316 Spondylolisthesis, lumbar region: Principal | ICD-10-CM | POA: Diagnosis present

## 2023-11-05 DIAGNOSIS — Z01818 Encounter for other preprocedural examination: Secondary | ICD-10-CM

## 2023-11-05 DIAGNOSIS — Z825 Family history of asthma and other chronic lower respiratory diseases: Secondary | ICD-10-CM

## 2023-11-05 DIAGNOSIS — Z9682 Presence of neurostimulator: Secondary | ICD-10-CM

## 2023-11-05 DIAGNOSIS — Z91030 Bee allergy status: Secondary | ICD-10-CM

## 2023-11-05 DIAGNOSIS — E785 Hyperlipidemia, unspecified: Secondary | ICD-10-CM | POA: Diagnosis present

## 2023-11-05 DIAGNOSIS — Z981 Arthrodesis status: Principal | ICD-10-CM

## 2023-11-05 DIAGNOSIS — Z800 Family history of malignant neoplasm of digestive organs: Secondary | ICD-10-CM

## 2023-11-05 DIAGNOSIS — Z79899 Other long term (current) drug therapy: Secondary | ICD-10-CM

## 2023-11-05 DIAGNOSIS — M4807 Spinal stenosis, lumbosacral region: Secondary | ICD-10-CM | POA: Diagnosis present

## 2023-11-05 DIAGNOSIS — Z801 Family history of malignant neoplasm of trachea, bronchus and lung: Secondary | ICD-10-CM

## 2023-11-05 DIAGNOSIS — Z01812 Encounter for preprocedural laboratory examination: Secondary | ICD-10-CM

## 2023-11-05 HISTORY — PX: SPINAL CORD STIMULATOR REMOVAL: SHX5379

## 2023-11-05 LAB — CBC
HCT: 42.7 % (ref 39.0–52.0)
Hemoglobin: 15.4 g/dL (ref 13.0–17.0)
MCH: 34.1 pg — ABNORMAL HIGH (ref 26.0–34.0)
MCHC: 36.1 g/dL — ABNORMAL HIGH (ref 30.0–36.0)
MCV: 94.5 fL (ref 80.0–100.0)
Platelets: 167 10*3/uL (ref 150–400)
RBC: 4.52 MIL/uL (ref 4.22–5.81)
RDW: 12.9 % (ref 11.5–15.5)
WBC: 5.9 10*3/uL (ref 4.0–10.5)
nRBC: 0.3 % — ABNORMAL HIGH (ref 0.0–0.2)

## 2023-11-05 LAB — PROTIME-INR
INR: 1 (ref 0.8–1.2)
Prothrombin Time: 13.5 s (ref 11.4–15.2)

## 2023-11-05 LAB — ABO/RH: ABO/RH(D): O POS

## 2023-11-05 SURGERY — POSTERIOR LUMBAR FUSION 2 LEVEL
Anesthesia: General | Site: Back

## 2023-11-05 MED ORDER — ONDANSETRON HCL 4 MG/2ML IJ SOLN: 4.0000 mg | Freq: Once | INTRAMUSCULAR | Status: DC | PRN

## 2023-11-05 MED ORDER — PHENYLEPHRINE 80 MCG/ML (10ML) SYRINGE FOR IV PUSH (FOR BLOOD PRESSURE SUPPORT)
PREFILLED_SYRINGE | INTRAVENOUS | Status: AC
  Filled 2023-11-05: qty 10

## 2023-11-05 MED ORDER — CELECOXIB 200 MG PO CAPS
200.0000 mg | ORAL_CAPSULE | Freq: Two times a day (BID) | ORAL | Status: DC
  Administered 2023-11-05 – 2023-11-06 (×2): 200 mg via ORAL
  Filled 2023-11-05 (×2): qty 1

## 2023-11-05 MED ORDER — PROPOFOL 10 MG/ML IV BOLUS
INTRAVENOUS | Status: AC
  Filled 2023-11-05: qty 20

## 2023-11-05 MED ORDER — HYDROMORPHONE HCL 1 MG/ML IJ SOLN
INTRAMUSCULAR | Status: DC | PRN
  Administered 2023-11-05 (×2): .5 mg via INTRAVENOUS

## 2023-11-05 MED ORDER — SUCCINYLCHOLINE CHLORIDE 200 MG/10ML IV SOSY
PREFILLED_SYRINGE | INTRAVENOUS | Status: AC
  Filled 2023-11-05: qty 10

## 2023-11-05 MED ORDER — BUPIVACAINE HCL (PF) 0.25 % IJ SOLN
INTRAMUSCULAR | Status: DC | PRN
  Administered 2023-11-05: 5 mL

## 2023-11-05 MED ORDER — CHLORHEXIDINE GLUCONATE 0.12 % MT SOLN: 15.0000 mL | Freq: Once | OROMUCOSAL | Status: AC

## 2023-11-05 MED ORDER — ONDANSETRON HCL 4 MG/2ML IJ SOLN
INTRAMUSCULAR | Status: DC | PRN
  Administered 2023-11-05: 4 mg via INTRAVENOUS

## 2023-11-05 MED ORDER — ACETAMINOPHEN 500 MG PO TABS
ORAL_TABLET | ORAL | Status: AC
  Filled 2023-11-05: qty 2

## 2023-11-05 MED ORDER — 0.9 % SODIUM CHLORIDE (POUR BTL) OPTIME
TOPICAL | Status: DC | PRN
  Administered 2023-11-05: 1000 mL

## 2023-11-05 MED ORDER — CEFAZOLIN SODIUM-DEXTROSE 2-4 GM/100ML-% IV SOLN
2.0000 g | INTRAVENOUS | Status: AC
  Administered 2023-11-05: 2 g via INTRAVENOUS

## 2023-11-05 MED ORDER — GABAPENTIN 300 MG PO CAPS
ORAL_CAPSULE | ORAL | Status: AC
  Administered 2023-11-05: 300 mg via ORAL
  Filled 2023-11-05: qty 1

## 2023-11-05 MED ORDER — ACETAMINOPHEN 500 MG PO TABS: 1000.0000 mg | ORAL_TABLET | Freq: Once | ORAL | Status: DC

## 2023-11-05 MED ORDER — ESMOLOL HCL 100 MG/10ML IV SOLN
INTRAVENOUS | Status: DC | PRN
  Administered 2023-11-05 (×2): 30 mg via INTRAVENOUS

## 2023-11-05 MED ORDER — AMISULPRIDE (ANTIEMETIC) 5 MG/2ML IV SOLN: 10.0000 mg | Freq: Once | INTRAVENOUS | Status: DC | PRN

## 2023-11-05 MED ORDER — KETOROLAC TROMETHAMINE 30 MG/ML IJ SOLN
INTRAMUSCULAR | Status: AC
  Filled 2023-11-05: qty 1

## 2023-11-05 MED ORDER — ONDANSETRON HCL 4 MG/2ML IJ SOLN
INTRAMUSCULAR | Status: AC
  Filled 2023-11-05: qty 2

## 2023-11-05 MED ORDER — DEXAMETHASONE SODIUM PHOSPHATE 10 MG/ML IJ SOLN
INTRAMUSCULAR | Status: AC
  Filled 2023-11-05: qty 1

## 2023-11-05 MED ORDER — HYDROMORPHONE HCL 1 MG/ML IJ SOLN
INTRAMUSCULAR | Status: AC
  Filled 2023-11-05: qty 0.5

## 2023-11-05 MED ORDER — SUGAMMADEX SODIUM 200 MG/2ML IV SOLN
INTRAVENOUS | Status: DC | PRN
  Administered 2023-11-05: 200 mg via INTRAVENOUS

## 2023-11-05 MED ORDER — SODIUM CHLORIDE 0.9% FLUSH: 3.0000 mL | Freq: Two times a day (BID) | INTRAVENOUS | Status: DC

## 2023-11-05 MED ORDER — PHENOL 1.4 % MT LIQD: 1.0000 | OROMUCOSAL | Status: DC | PRN

## 2023-11-05 MED ORDER — MENTHOL 3 MG MT LOZG: 1.0000 | LOZENGE | OROMUCOSAL | Status: DC | PRN

## 2023-11-05 MED ORDER — OXYCODONE HCL 5 MG/5ML PO SOLN: 5.0000 mg | Freq: Once | ORAL | Status: DC | PRN

## 2023-11-05 MED ORDER — ACETAMINOPHEN 10 MG/ML IV SOLN
INTRAVENOUS | Status: AC
  Filled 2023-11-05: qty 100

## 2023-11-05 MED ORDER — THROMBIN 20000 UNITS EX SOLR
CUTANEOUS | Status: AC
  Filled 2023-11-05: qty 20000

## 2023-11-05 MED ORDER — DEXAMETHASONE SODIUM PHOSPHATE 4 MG/ML IJ SOLN: 4.0000 mg | Freq: Four times a day (QID) | INTRAMUSCULAR | Status: DC

## 2023-11-05 MED ORDER — MEPERIDINE HCL 25 MG/ML IJ SOLN: 6.2500 mg | INTRAMUSCULAR | Status: DC | PRN

## 2023-11-05 MED ORDER — FENTANYL CITRATE (PF) 250 MCG/5ML IJ SOLN
INTRAMUSCULAR | Status: AC
  Filled 2023-11-05: qty 5

## 2023-11-05 MED ORDER — ROCURONIUM BROMIDE 10 MG/ML (PF) SYRINGE
PREFILLED_SYRINGE | INTRAVENOUS | Status: AC
  Filled 2023-11-05: qty 10

## 2023-11-05 MED ORDER — OXYCODONE HCL 5 MG PO TABS: 5.0000 mg | ORAL_TABLET | Freq: Once | ORAL | Status: DC | PRN

## 2023-11-05 MED ORDER — THROMBIN 20000 UNITS EX SOLR
CUTANEOUS | Status: DC | PRN
  Administered 2023-11-05 (×2): 20 mL via TOPICAL

## 2023-11-05 MED ORDER — ACETAMINOPHEN 500 MG PO TABS: 1000.0000 mg | ORAL_TABLET | ORAL | Status: DC

## 2023-11-05 MED ORDER — ONDANSETRON HCL 4 MG PO TABS: 4.0000 mg | ORAL_TABLET | Freq: Four times a day (QID) | ORAL | Status: DC | PRN

## 2023-11-05 MED ORDER — SUCCINYLCHOLINE CHLORIDE 200 MG/10ML IV SOSY
PREFILLED_SYRINGE | INTRAVENOUS | Status: DC | PRN
  Administered 2023-11-05: 160 mg via INTRAVENOUS

## 2023-11-05 MED ORDER — CHLORHEXIDINE GLUCONATE CLOTH 2 % EX PADS: 6.0000 | MEDICATED_PAD | Freq: Once | CUTANEOUS | Status: DC

## 2023-11-05 MED ORDER — BUPIVACAINE HCL (PF) 0.25 % IJ SOLN
INTRAMUSCULAR | Status: AC
  Filled 2023-11-05: qty 30

## 2023-11-05 MED ORDER — DEXMEDETOMIDINE HCL IN NACL 80 MCG/20ML IV SOLN
INTRAVENOUS | Status: DC | PRN
  Administered 2023-11-05: 20 ug via INTRAVENOUS
  Administered 2023-11-05: 12 ug via INTRAVENOUS

## 2023-11-05 MED ORDER — MUPIROCIN 2 % EX OINT: 1.0000 | TOPICAL_OINTMENT | Freq: Two times a day (BID) | CUTANEOUS | 0 refills | Status: AC

## 2023-11-05 MED ORDER — ACETAMINOPHEN 10 MG/ML IV SOLN
INTRAVENOUS | Status: DC | PRN
  Administered 2023-11-05: 1000 mg via INTRAVENOUS

## 2023-11-05 MED ORDER — THROMBIN 5000 UNITS EX SOLR
OROMUCOSAL | Status: DC | PRN
  Administered 2023-11-05 (×2): 5 mL via TOPICAL

## 2023-11-05 MED ORDER — KETAMINE HCL 50 MG/5ML IJ SOSY
PREFILLED_SYRINGE | INTRAMUSCULAR | Status: AC
  Filled 2023-11-05: qty 5

## 2023-11-05 MED ORDER — GABAPENTIN 300 MG PO CAPS: 300.0000 mg | ORAL_CAPSULE | ORAL | Status: AC

## 2023-11-05 MED ORDER — DEXAMETHASONE SODIUM PHOSPHATE 10 MG/ML IJ SOLN
INTRAMUSCULAR | Status: DC | PRN
  Administered 2023-11-05: 10 mg via INTRAVENOUS

## 2023-11-05 MED ORDER — MIDAZOLAM HCL 2 MG/2ML IJ SOLN
INTRAMUSCULAR | Status: DC | PRN
  Administered 2023-11-05: 2 mg via INTRAVENOUS

## 2023-11-05 MED ORDER — KETAMINE HCL 10 MG/ML IJ SOLN
INTRAMUSCULAR | Status: DC | PRN
  Administered 2023-11-05: 20 mg via INTRAVENOUS
  Administered 2023-11-05: 30 mg via INTRAVENOUS

## 2023-11-05 MED ORDER — METHOCARBAMOL 1000 MG/10ML IJ SOLN: 500.0000 mg | Freq: Four times a day (QID) | INTRAMUSCULAR | Status: DC | PRN

## 2023-11-05 MED ORDER — ROCURONIUM BROMIDE 10 MG/ML (PF) SYRINGE
PREFILLED_SYRINGE | INTRAVENOUS | Status: DC | PRN
  Administered 2023-11-05 (×2): 20 mg via INTRAVENOUS
  Administered 2023-11-05: 100 mg via INTRAVENOUS
  Administered 2023-11-05: 20 mg via INTRAVENOUS

## 2023-11-05 MED ORDER — ALBUTEROL SULFATE HFA 108 (90 BASE) MCG/ACT IN AERS
INHALATION_SPRAY | RESPIRATORY_TRACT | Status: DC | PRN
  Administered 2023-11-05: 8 via RESPIRATORY_TRACT

## 2023-11-05 MED ORDER — ACETAMINOPHEN 500 MG PO TABS
1000.0000 mg | ORAL_TABLET | Freq: Four times a day (QID) | ORAL | Status: DC
  Administered 2023-11-05 – 2023-11-06 (×3): 1000 mg via ORAL
  Filled 2023-11-05 (×3): qty 2

## 2023-11-05 MED ORDER — VASHE WOUND IRRIGATION OPTIME
TOPICAL | Status: DC | PRN
  Administered 2023-11-05: 34 [oz_av]

## 2023-11-05 MED ORDER — THROMBIN 5000 UNITS EX SOLR
CUTANEOUS | Status: AC
  Filled 2023-11-05: qty 10000

## 2023-11-05 MED ORDER — PROPOFOL 10 MG/ML IV BOLUS
INTRAVENOUS | Status: DC | PRN
  Administered 2023-11-05: 50 mg via INTRAVENOUS
  Administered 2023-11-05: 200 mg via INTRAVENOUS

## 2023-11-05 MED ORDER — GLYCOPYRROLATE PF 0.2 MG/ML IJ SOSY
PREFILLED_SYRINGE | INTRAMUSCULAR | Status: AC
  Filled 2023-11-05: qty 1

## 2023-11-05 MED ORDER — LIDOCAINE 2% (20 MG/ML) 5 ML SYRINGE
INTRAMUSCULAR | Status: AC
  Filled 2023-11-05: qty 5

## 2023-11-05 MED ORDER — SODIUM CHLORIDE 0.9% FLUSH: 3.0000 mL | INTRAVENOUS | Status: DC | PRN

## 2023-11-05 MED ORDER — THROMBIN 5000 UNITS EX SOLR
CUTANEOUS | Status: AC
  Filled 2023-11-05: qty 5000

## 2023-11-05 MED ORDER — CHLORHEXIDINE GLUCONATE 4 % EX SOLN: 1.0000 | CUTANEOUS | 1 refills | Status: AC

## 2023-11-05 MED ORDER — KETOROLAC TROMETHAMINE 30 MG/ML IJ SOLN
30.0000 mg | Freq: Once | INTRAMUSCULAR | Status: AC | PRN
  Administered 2023-11-05: 30 mg via INTRAVENOUS

## 2023-11-05 MED ORDER — LISINOPRIL 20 MG PO TABS
20.0000 mg | ORAL_TABLET | Freq: Every day | ORAL | Status: DC
  Administered 2023-11-05 – 2023-11-06 (×2): 20 mg via ORAL
  Filled 2023-11-05 (×2): qty 1

## 2023-11-05 MED ORDER — HYDROMORPHONE HCL 1 MG/ML IJ SOLN: 1.0000 mg | INTRAMUSCULAR | Status: DC | PRN

## 2023-11-05 MED ORDER — MIDAZOLAM HCL 2 MG/2ML IJ SOLN
INTRAMUSCULAR | Status: AC
  Filled 2023-11-05: qty 2

## 2023-11-05 MED ORDER — METHOCARBAMOL 500 MG PO TABS
500.0000 mg | ORAL_TABLET | Freq: Four times a day (QID) | ORAL | Status: DC | PRN
  Administered 2023-11-05 – 2023-11-06 (×2): 500 mg via ORAL
  Filled 2023-11-05 (×2): qty 1

## 2023-11-05 MED ORDER — LACTATED RINGERS IV SOLN: INTRAVENOUS | Status: DC

## 2023-11-05 MED ORDER — ESMOLOL HCL 100 MG/10ML IV SOLN
INTRAVENOUS | Status: AC
  Filled 2023-11-05: qty 10

## 2023-11-05 MED ORDER — ONDANSETRON HCL 4 MG/2ML IJ SOLN: 4.0000 mg | Freq: Four times a day (QID) | INTRAMUSCULAR | Status: DC | PRN

## 2023-11-05 MED ORDER — LIDOCAINE 2% (20 MG/ML) 5 ML SYRINGE
INTRAMUSCULAR | Status: DC | PRN
  Administered 2023-11-05: 60 mg via INTRAVENOUS

## 2023-11-05 MED ORDER — SENNA 8.6 MG PO TABS
1.0000 | ORAL_TABLET | Freq: Two times a day (BID) | ORAL | Status: DC
  Administered 2023-11-05 – 2023-11-06 (×2): 8.6 mg via ORAL
  Filled 2023-11-05 (×2): qty 1

## 2023-11-05 MED ORDER — VANCOMYCIN HCL 1000 MG IV SOLR
INTRAVENOUS | Status: AC
  Filled 2023-11-05: qty 20

## 2023-11-05 MED ORDER — ZINC SULFATE 220 (50 ZN) MG PO CAPS
220.0000 mg | ORAL_CAPSULE | Freq: Every day | ORAL | Status: DC
  Administered 2023-11-05 – 2023-11-06 (×2): 220 mg via ORAL
  Filled 2023-11-05 (×2): qty 1

## 2023-11-05 MED ORDER — HYDROMORPHONE HCL 1 MG/ML IJ SOLN
0.2500 mg | INTRAMUSCULAR | Status: DC | PRN
  Administered 2023-11-05 (×2): 0.5 mg via INTRAVENOUS

## 2023-11-05 MED ORDER — CEFAZOLIN SODIUM-DEXTROSE 2-4 GM/100ML-% IV SOLN
INTRAVENOUS | Status: AC
  Filled 2023-11-05: qty 100

## 2023-11-05 MED ORDER — OXYCODONE HCL 5 MG PO TABS
10.0000 mg | ORAL_TABLET | ORAL | Status: DC | PRN
  Administered 2023-11-05 – 2023-11-06 (×5): 10 mg via ORAL
  Filled 2023-11-05 (×5): qty 2

## 2023-11-05 MED ORDER — HYDROMORPHONE HCL 1 MG/ML IJ SOLN
INTRAMUSCULAR | Status: AC
  Filled 2023-11-05: qty 1

## 2023-11-05 MED ORDER — ZINC GLUCONATE 50 MG PO TABS: 50.0000 mg | ORAL_TABLET | Freq: Every day | ORAL | Status: DC

## 2023-11-05 MED ORDER — PROPOFOL 500 MG/50ML IV EMUL
INTRAVENOUS | Status: DC | PRN
  Administered 2023-11-05: 150 ug/kg/min via INTRAVENOUS

## 2023-11-05 MED ORDER — CHLORHEXIDINE GLUCONATE 0.12 % MT SOLN
OROMUCOSAL | Status: AC
  Administered 2023-11-05: 15 mL via OROMUCOSAL
  Filled 2023-11-05: qty 15

## 2023-11-05 MED ORDER — SODIUM CHLORIDE 0.9 % IV SOLN
250.0000 mL | INTRAVENOUS | Status: DC
  Administered 2023-11-05: 250 mL via INTRAVENOUS

## 2023-11-05 MED ORDER — ORAL CARE MOUTH RINSE: 15.0000 mL | Freq: Once | OROMUCOSAL | Status: AC

## 2023-11-05 MED ORDER — PHENYLEPHRINE HCL-NACL 20-0.9 MG/250ML-% IV SOLN
INTRAVENOUS | Status: DC | PRN
  Administered 2023-11-05 (×4): 160 ug via INTRAVENOUS
  Administered 2023-11-05: 50 ug/min via INTRAVENOUS
  Administered 2023-11-05: 160 ug via INTRAVENOUS
  Administered 2023-11-05: 80 ug via INTRAVENOUS
  Administered 2023-11-05 (×7): 160 ug via INTRAVENOUS

## 2023-11-05 MED ORDER — FENTANYL CITRATE (PF) 250 MCG/5ML IJ SOLN
INTRAMUSCULAR | Status: DC | PRN
  Administered 2023-11-05: 50 ug via INTRAVENOUS
  Administered 2023-11-05 (×2): 100 ug via INTRAVENOUS

## 2023-11-05 MED ORDER — ADULT MULTIVITAMIN W/MINERALS CH
1.0000 | ORAL_TABLET | Freq: Every day | ORAL | Status: DC
  Administered 2023-11-05 – 2023-11-06 (×2): 1 via ORAL
  Filled 2023-11-05 (×2): qty 1

## 2023-11-05 MED ORDER — CEFAZOLIN SODIUM-DEXTROSE 2-4 GM/100ML-% IV SOLN
2.0000 g | Freq: Three times a day (TID) | INTRAVENOUS | Status: AC
  Administered 2023-11-05 – 2023-11-06 (×2): 2 g via INTRAVENOUS
  Filled 2023-11-05: qty 100

## 2023-11-05 MED ORDER — ALBUMIN HUMAN 5 % IV SOLN
INTRAVENOUS | Status: DC | PRN
Start: 2023-11-05 — End: 2023-11-05

## 2023-11-05 MED ORDER — DEXAMETHASONE 4 MG PO TABS
4.0000 mg | ORAL_TABLET | Freq: Four times a day (QID) | ORAL | Status: DC
  Administered 2023-11-05 – 2023-11-06 (×3): 4 mg via ORAL
  Filled 2023-11-05 (×3): qty 1

## 2023-11-05 SURGICAL SUPPLY — 62 items
ALLOGRAFT BONE FIBER KORE 5 (Bone Implant) IMPLANT
BAG COUNTER SPONGE SURGICOUNT (BAG) ×2 IMPLANT
BASKET BONE COLLECTION (BASKET) ×1 IMPLANT
BENZOIN TINCTURE PRP APPL 2/3 (GAUZE/BANDAGES/DRESSINGS) ×2 IMPLANT
BLADE BONE MILL MEDIUM (MISCELLANEOUS) ×1 IMPLANT
BLADE CLIPPER SURG (BLADE) IMPLANT
BUR CARBIDE MATCH 3.0 (BURR) ×1 IMPLANT
CANISTER SUCT 3000ML PPV (MISCELLANEOUS) ×2 IMPLANT
CLEANSER WND VASHE INSTL 34OZ (WOUND CARE) IMPLANT
CNTNR URN SCR LID CUP LEK RST (MISCELLANEOUS) ×1 IMPLANT
COVER BACK TABLE 60X90IN (DRAPES) ×1 IMPLANT
DERMABOND ADVANCED .7 DNX12 (GAUZE/BANDAGES/DRESSINGS) ×1 IMPLANT
DRAPE C-ARM 42X72 X-RAY (DRAPES) ×4 IMPLANT
DRAPE C-ARMOR (DRAPES) ×2 IMPLANT
DRAPE LAPAROTOMY 100X72X124 (DRAPES) ×2 IMPLANT
DRAPE SURG 17X23 STRL (DRAPES) ×2 IMPLANT
DRSG OPSITE 4X5.5 SM (GAUZE/BANDAGES/DRESSINGS) IMPLANT
DRSG OPSITE POSTOP 3X4 (GAUZE/BANDAGES/DRESSINGS) IMPLANT
DRSG OPSITE POSTOP 4X6 (GAUZE/BANDAGES/DRESSINGS) IMPLANT
DURAPREP 26ML APPLICATOR (WOUND CARE) ×2 IMPLANT
ELECT REM PT RETURN 9FT ADLT (ELECTROSURGICAL) ×1
ELECTRODE REM PT RTRN 9FT ADLT (ELECTROSURGICAL) ×2 IMPLANT
EVACUATOR 1/8 PVC DRAIN (DRAIN) ×1 IMPLANT
GAUZE 4X4 16PLY ~~LOC~~+RFID DBL (SPONGE) IMPLANT
GAUZE SPONGE 4X4 12PLY STRL LF (GAUZE/BANDAGES/DRESSINGS) IMPLANT
GLOVE BIO SURGEON STRL SZ7 (GLOVE) IMPLANT
GLOVE BIO SURGEON STRL SZ8 (GLOVE) ×3 IMPLANT
GLOVE BIOGEL PI IND STRL 7.0 (GLOVE) IMPLANT
GOWN STRL REUS W/ TWL LRG LVL3 (GOWN DISPOSABLE) IMPLANT
GOWN STRL REUS W/ TWL XL LVL3 (GOWN DISPOSABLE) ×3 IMPLANT
GOWN STRL REUS W/TWL 2XL LVL3 (GOWN DISPOSABLE) IMPLANT
HEMOSTAT POWDER KIT SURGIFOAM (HEMOSTASIS) ×1 IMPLANT
KIT BASIN OR (CUSTOM PROCEDURE TRAY) ×2 IMPLANT
KIT INFUSE X SMALL 1.4CC (Orthopedic Implant) IMPLANT
KIT TURNOVER KIT B (KITS) ×2 IMPLANT
MILL BONE PREP (MISCELLANEOUS) ×1 IMPLANT
NDL HYPO 25X1 1.5 SAFETY (NEEDLE) ×2 IMPLANT
NDL SPNL 20GX3.5 QUINCKE YW (NEEDLE) IMPLANT
NEEDLE HYPO 25X1 1.5 SAFETY (NEEDLE) ×1
NEEDLE SPNL 20GX3.5 QUINCKE YW (NEEDLE)
NS IRRIG 1000ML POUR BTL (IV SOLUTION) ×2 IMPLANT
PACK LAMINECTOMY NEURO (CUSTOM PROCEDURE TRAY) ×2 IMPLANT
PAD ARMBOARD 7.5X6 YLW CONV (MISCELLANEOUS) ×6 IMPLANT
ROD LORD LIPPED TI 5.5X60 (Rod) IMPLANT
SCREW CORT SHANK MOD 6.5X40 (Screw) IMPLANT
SCREW KODIAK 6.5X45 (Screw) IMPLANT
SCREW POLYAXIAL TULIP (Screw) IMPLANT
SET SCREW SPNE (Screw) IMPLANT
SOLUTION IRRIG SURGIPHOR (IV SOLUTION) ×1 IMPLANT
SPACER IDENTITI 9X12X25 10D (Spacer) IMPLANT
SPACER IDENTITI PS 10X9X25 10D (Spacer) IMPLANT
SPONGE SURGIFOAM ABS GEL 100 (HEMOSTASIS) ×1 IMPLANT
SPONGE T-LAP 4X18 ~~LOC~~+RFID (SPONGE) IMPLANT
STRIP CLOSURE SKIN 1/2X4 (GAUZE/BANDAGES/DRESSINGS) ×3 IMPLANT
SUT VIC AB 0 CT1 18XCR BRD8 (SUTURE) ×2 IMPLANT
SUT VIC AB 2-0 CP2 18 (SUTURE) ×2 IMPLANT
SUT VIC AB 3-0 SH 8-18 (SUTURE) ×3 IMPLANT
SYR CONTROL 10ML LL (SYRINGE) ×1 IMPLANT
TOWEL GREEN STERILE (TOWEL DISPOSABLE) ×2 IMPLANT
TOWEL GREEN STERILE FF (TOWEL DISPOSABLE) ×2 IMPLANT
TRAY FOLEY MTR SLVR 16FR STAT (SET/KITS/TRAYS/PACK) ×1 IMPLANT
WATER STERILE IRR 1000ML POUR (IV SOLUTION) ×2 IMPLANT

## 2023-11-05 NOTE — Plan of Care (Signed)

## 2023-11-05 NOTE — Op Note (Signed)
 11/05/2023  4:27 PM  PATIENT:  Anthony Golden  58 y.o. male  PRE-OPERATIVE DIAGNOSIS: Spondylolisthesis L4-5 L5-S1, spinal stenosis L4-5 L5-S1, neck pain with radiculopathy, nonfunctioning multifidus stimulator  POST-OPERATIVE DIAGNOSIS:  same  PROCEDURE:   1. Decompressive lumbar laminectomy, hemi facetectomy and foraminotomies L4-5 L5-S1 requiring more work than would be required for a simple exposure of the disk for PLIF in order to adequately decompress the neural elements and address the spinal stenosis, with Gill type decompression at L5-S1 2. Posterior lumbar interbody fusion L4-5 L5-S1 using PTI interbody cages packed with morcellized allograft and autograft  3. Posterior fixation L4-S1 inclusive using [employer] cortical pedicle screws.  4. Intertransverse arthrodesis L4-S1 using morcellized autograft and allograft. 5.  Removal of nonfunctioning multifidus stimulator including the battery and both leads  SURGEON:  Alm Molt, MD  ASSISTANTS: Suzen Pean, FNP  ANESTHESIA:  General  EBL: 1000 ml  Total I/O In: 2350 [I.V.:1750; IV Piggyback:600] Out: 1200 [Urine:200; Blood:1000]  BLOOD ADMINISTERED:none  DRAINS: none   INDICATION FOR PROCEDURE: This patient presented with back pain and leg pain. Imaging revealed dynamic spondylolisthesis at L4-5 and a lytic spondylolisthesis at L5-S1 with stenosis. The patient tried a reasonable attempt at conservative medical measures without relief. I recommended decompression and instrumented fusion to address the stenosis as well as the segmental  instability.  Had a nonfunctioning multifidus stimulator that needed to be removed at the same time in order to expose L4-5 and L5-S1 to perform the surgery.  Patient understood the risks, benefits, and alternatives and potential outcomes and wished to proceed.  PROCEDURE DETAILS:  The patient was brought to the operating room. After induction of generalized endotracheal anesthesia the patient  was rolled into the prone position on chest rolls and all pressure points were padded. The patient's lumbar region was cleaned and then prepped with DuraPrep and draped in the usual sterile fashion. Anesthesia was injected and then a dorsal midline incision was made and carried down to the lumbosacral fascia. The fascia was opened and the paraspinous musculature was taken down in a subperiosteal fashion to expose L4-5 and L5-S1.  We made an incision over the lateral flank and expose the battery and then remove the battery.  We cut the leads and remove the battery.  We then pulled the leads out of the multifidus and remove them.  A self-retaining retractor was placed. Intraoperative fluoroscopy confirmed my level, and I started with placement of the L4 cortical pedicle screws. The pedicle screw entry zones were identified utilizing surface landmarks and  AP and lateral fluoroscopy. I scored the cortex with the high-speed drill and then used the hand drill to drill an upward and outward direction into the pedicle. I then tapped line to line. I then placed a 6.5 x 40 mm cortical pedicle screw into the pedicles of L4 bilaterally.  I irrigated the battery site and closed that wound with a layer of 2-0 Vicryl in the subcutaneous tissues and 3-0 Vicryl in the subcuticular tissue.   I then turned my attention to the decompression and complete lumbar laminectomies, hemi- facetectomies, and foraminotomies were performed at L4-5 and L5-S1.  Gill type decompression was performed at L5-S1 because of the spondylolysis.  My nurse practitioner was directly involved in the decompression and exposure of the neural elements. the patient had significant spinal stenosis and this required more work than would be required for a simple exposure of the disc for posterior lumbar interbody fusion which would only require a limited  laminotomy. Much more generous decompression and generous foraminotomy was undertaken in order to adequately  decompress the neural elements and address the patient's leg pain. The yellow ligament was removed to expose the underlying dura and nerve roots, and generous foraminotomies were performed to adequately decompress the neural elements. Both the exiting and traversing nerve roots were decompressed on both sides until a coronary dilator passed easily along the nerve roots. Once the decompression was complete, I turned my attention to the posterior lower lumbar interbody fusion. The epidural venous vasculature was coagulated and cut sharply. Disc space was incised and the initial discectomy was performed with pituitary rongeurs. The disc space was distracted with sequential distractors to a height of 12 mm at L4-5 and 10 mm at L5-S1. We then used a series of scrapers and shavers to prepare the endplates for fusion. The midline was prepared with Epstein curettes. Once the complete discectomy was finished, we packed an appropriate sized interbody cage with local autograft and morcellized allograft, gently retracted the nerve root, and tapped the cage into position at L4-5 and L5-S1 bilaterally.  The midline between the cages was packed with morselized autograft and allograft.   We then turned our attention to the placement of the lower pedicle screws. The pedicle screw entry zones were identified utilizing surface landmarks and fluoroscopy. I drilled into each pedicle utilizing the hand drill, and tapped each pedicle with the appropriate tap. We palpated with a ball probe to assure no break in the cortex. We then placed 6.5 x 45 mm pedicle screws into the pedicles bilaterally at L5 and S1 bilaterally.  My nurse practitioner assisted in placement of the pedicle screws.  We then decorticated the transverse processes and laid a mixture of morcellized autograft and allograft out over these to perform intertransverse arthrodesis at L4-S1. We then placed lordotic rods into the multiaxial screw heads of the pedicle screws and  locked these in position with the locking caps and anti-torque device. We then checked our construct with AP and lateral fluoroscopy. Irrigated with copious amounts of Vashe solution. Inspected the nerve roots once again to assure adequate decompression, lined to the dura with Gelfoam, placed a medium Hemovac drain through a separate stab incision, and then we closed the muscle and the fascia with 0 Vicryl. Closed the subcutaneous tissues with 2-0 Vicryl and subcuticular tissues with 3-0 Vicryl. The skin was closed with benzoin and Steri-Strips. Dressing was then applied, the patient was awakened from general anesthesia and transported to the recovery room in stable condition. At the end of the procedure all sponge, needle and instrument counts were correct.   PLAN OF CARE: admit to inpatient  PATIENT DISPOSITION:  PACU - hemodynamically stable.   Delay start of Pharmacological VTE agent (>24hrs) due to surgical blood loss or risk of bleeding:  yes

## 2023-11-05 NOTE — H&P (Signed)
 Subjective: Patient is a 58 y.o. male admitted for back and leg pain. Onset of symptoms was several years ago, gradually worsening since that time.  The pain is rated severe, and is located at the across the lower back and radiates to legs. The pain is described as aching and occurs all day. The symptoms have been progressive. Symptoms are exacerbated by exercise, standing, and walking for more than a few minutes. MRI or CT showed a  spondylolisthesis with stenosis L3-4 L4-5  Past Medical History:  Diagnosis Date   HLD (hyperlipidemia)    Hypertension    Sleep apnea    uses CPAP nightly    Past Surgical History:  Procedure Laterality Date   COLONOSCOPY     EYE SURGERY Bilateral    Lasik   TMJ ARTHROSCOPY     WISDOM TOOTH EXTRACTION      Prior to Admission medications   Medication Sig Start Date End Date Taking? Authorizing Provider  acetaminophen  (TYLENOL ) 500 MG tablet Take 1,000 mg by mouth every 6 (six) hours as needed for mild pain (pain score 1-3) or moderate pain (pain score 4-6).   Yes [provider]  ECHINACEA PO Take 1 capsule by mouth daily.   Yes [provider]  ibuprofen  (ADVIL ) 200 MG tablet Take 600 mg by mouth every 6 (six) hours as needed for moderate pain (pain score 4-6).   Yes [provider]  lisinopril  (ZESTRIL ) 20 MG tablet Take 20 mg by mouth daily.   Yes [provider]  Multiple Vitamin (MULTIVITAMIN WITH MINERALS) TABS tablet Take 1 tablet by mouth daily.   Yes [provider]  NON FORMULARY Pt uses a cpap nightly   Yes [provider]  pravastatin  (PRAVACHOL ) 40 MG tablet TAKE 1 TABLET(40 MG) BY MOUTH DAILY 05/07/23  Yes Lavona Agent, MD  sodium chloride  (OCEAN) 0.65 % SOLN nasal spray Place 1 spray into both nostrils as needed for congestion.   Yes [provider]  zinc  gluconate 50 MG tablet Take 50 mg by mouth daily.   Yes [provider]   Allergies  Allergen Reactions   Bee Venom  Hives    Social History   Tobacco Use   Smoking status: Never   Smokeless tobacco: Never  Substance Use Topics   Alcohol use: Yes    Alcohol/week: 10.0 standard drinks of alcohol    Types: 10 Cans of beer per week    Family History  Problem Relation Age of Onset   Cancer Mother        Lung cancer and colon cancer   COPD Mother    Heart disease Father        Died suddenly age 36.       Review of Systems  Positive ROS: neg  All other systems have been reviewed and were otherwise negative with the exception of those mentioned in the HPI and as above.  Objective: Vital signs in last 24 hours: Temp:  [98.1 F (36.7 C)] 98.1 F (36.7 C) (02/07 1049) Pulse Rate:  [83] 83 (02/07 1049) Resp:  [18] 18 (02/07 1049) BP: (155)/(94) 155/94 (02/07 1049) SpO2:  [95 %] 95 % (02/07 1049) Weight:  [109.8 kg] 109.8 kg (02/07 1049)  General Appearance: Alert, cooperative, no distress, appears stated age Head: Normocephalic, without obvious abnormality, atraumatic Eyes: PERRL, conjunctiva/corneas clear, EOM's intact    Neck: Supple, symmetrical, trachea midline Back: Symmetric, no curvature, ROM normal, no CVA tenderness Lungs:  respirations unlabored Heart: Regular  rate and rhythm Abdomen: Soft, non-tender Extremities: Extremities normal, atraumatic, no cyanosis or edema Pulses: 2+ and symmetric all extremities Skin: Skin color, texture, turgor normal, no rashes or lesions  NEUROLOGIC:   Mental status: Alert and oriented x4,  no aphasia, good attention span, fund of knowledge, and memory Motor Exam - grossly normal Sensory Exam - grossly normal Reflexes: 1+ Coordination - grossly normal Gait - grossly normal Balance - grossly normal Cranial Nerves: I: smell Not tested  II: visual acuity  OS: nl    OD: nl  II: visual fields Full to confrontation  II: pupils Equal, round, reactive to light  III,VII: ptosis None  III,IV,VI: extraocular muscles  Full ROM  V: mastication  Normal  V: facial light touch sensation  Normal  V,VII: corneal reflex  Present  VII: facial muscle function - upper  Normal  VII: facial muscle function - lower Normal  VIII: hearing Not tested  IX: soft palate elevation  Normal  IX,X: gag reflex Present  XI: trapezius strength  5/5  XI: sternocleidomastoid strength 5/5  XI: neck flexion strength  5/5  XII: tongue strength  Normal    Data Review Lab Results  Component Value Date   WBC 8.1 06/08/2019   HGB 15.8 06/08/2019   HCT 46.2 06/08/2019   MCV 94.7 06/08/2019   PLT 269 06/08/2019   Lab Results  Component Value Date   NA 136 09/05/2019   K 4.3 09/05/2019   CL 99 09/05/2019   CO2 22 09/05/2019   BUN 13 09/05/2019   CREATININE 0.68 (L) 09/05/2019   GLUCOSE 91 09/05/2019   No results found for: INR, PROTIME  Assessment/Plan:  Estimated body mass index is 34.72 kg/m as calculated from the following:   Height as of this encounter: 5' 10 (1.778 m).   Weight as of this encounter: 109.8 kg. Patient admitted for PLIF L4-5, L5-S1. Patient has failed a reasonable attempt at conservative therapy.  I explained the condition and procedure to the patient and answered any questions.  Patient wishes to proceed with procedure as planned. Understands risks/ benefits and typical outcomes of procedure.   Alm GORMAN Molt 11/05/2023 11:05 AM

## 2023-11-05 NOTE — Transfer of Care (Signed)
 Immediate Anesthesia Transfer of Care Note  Patient: Anthony Golden  Procedure(s) Performed: Posterior Lumbar Interbody Fusion - Lumbar four-Lumbar five - Lumbar five-Sacral one - Posterior Lateral and Interbody fusion (Back) Removal of [employer] multifidus stimulator (Back)  Patient Location: PACU  Anesthesia Type:General  Level of Consciousness: awake, alert , and oriented  Airway & Oxygen Therapy: Patient Spontanous Breathing and Patient connected to face mask oxygen  Post-op Assessment: Report given to RN and Post -op Vital signs reviewed and stable  Post vital signs: Reviewed and stable  Last Vitals:  Vitals Value Taken Time  BP 128/74 11/05/23 1632  Temp 36.7 C 11/05/23 1632  Pulse 82 11/05/23 1641  Resp 14 11/05/23 1641  SpO2 88 % 11/05/23 1641  Vitals shown include unfiled device data.  Last Pain:  Vitals:   11/05/23 1058  PainSc: 8          Complications: No notable events documented.

## 2023-11-05 NOTE — Anesthesia Procedure Notes (Signed)
 Procedure Name: Intubation Date/Time: 11/05/2023 12:42 PM  Performed by: Mollie Olivia SAUNDERS, CRNAPre-anesthesia Checklist: Patient identified, Emergency Drugs available, Suction available and Patient being monitored Patient Re-evaluated:Patient Re-evaluated prior to induction Oxygen Delivery Method: Circle system utilized Preoxygenation: Pre-oxygenation with 100% oxygen Induction Type: IV induction and Cricoid Pressure applied Laryngoscope Size: Glidescope and 4 Grade View: Grade I Tube type: Oral Tube size: 7.5 mm Number of attempts: 1 Airway Equipment and Method: Stylet Placement Confirmation: ETT inserted through vocal cords under direct vision, positive ETCO2 and breath sounds checked- equal and bilateral Tube secured with: Tape Dental Injury: Teeth and Oropharynx as per pre-operative assessment  Future Recommendations: Recommend- induction with short-acting agent, and alternative techniques readily available Comments: Large tongue; metal in left jaw from injury in 30's ; stiff neck, and claims sore c movement; no numbness or tingling in Ue's

## 2023-11-05 NOTE — Anesthesia Preprocedure Evaluation (Addendum)
 Anesthesia Evaluation  Patient identified by MRN, date of birth, ID band Patient awake    Reviewed: Allergy & Precautions, H&P , NPO status , Patient's Chart, lab work & pertinent test results  Airway Mallampati: III  TM Distance: >3 FB Neck ROM: Full    Dental  (+) Teeth Intact, Dental Advisory Given   Pulmonary sleep apnea and Continuous Positive Airway Pressure Ventilation    Pulmonary exam normal breath sounds clear to auscultation       Cardiovascular hypertension (155/94 preop, 130s SBP), Pt. on medications Normal cardiovascular exam Rhythm:Regular Rate:Normal  Stress test 2024   He exercised for 9 minutes on the Bruce protocol achieving 10.1 METS of activity.   Normal blood pressure response.   Occasional PVCs noted.   Overall low risk exercise treadmill test with no electrocardiographic evidence of ischemia.    Neuro/Psych  PSYCHIATRIC DISORDERS  Depression    negative neurological ROS     GI/Hepatic negative GI ROS, Neg liver ROS,,,  Endo/Other  Obesity BMI 35  Renal/GU negative Renal ROS  negative genitourinary   Musculoskeletal negative musculoskeletal ROS (+)    Abdominal  (+) + obese  Peds negative pediatric ROS (+)  Hematology negative hematology ROS (+)   Anesthesia Other Findings   Reproductive/Obstetrics negative OB ROS                             Anesthesia Physical Anesthesia Plan  ASA: 2  Anesthesia Plan: General   Post-op Pain Management: Ketamine  IV* and Tylenol  PO (pre-op)*   Induction: Intravenous  PONV Risk Score and Plan: Ondansetron , Dexamethasone , Midazolam  and Treatment may vary due to age or medical condition  Airway Management Planned: Oral ETT  Additional Equipment: None  Intra-op Plan:   Post-operative Plan: Extubation in OR  Informed Consent: I have reviewed the patients History and Physical, chart, labs and discussed the procedure  including the risks, benefits and alternatives for the proposed anesthesia with the patient or authorized representative who has indicated his/her understanding and acceptance.     Dental advisory given  Plan Discussed with: CRNA  Anesthesia Plan Comments:        Anesthesia Quick Evaluation

## 2023-11-06 DIAGNOSIS — M48061 Spinal stenosis, lumbar region without neurogenic claudication: Secondary | ICD-10-CM | POA: Diagnosis present

## 2023-11-06 DIAGNOSIS — E785 Hyperlipidemia, unspecified: Secondary | ICD-10-CM | POA: Diagnosis present

## 2023-11-06 DIAGNOSIS — M4807 Spinal stenosis, lumbosacral region: Secondary | ICD-10-CM | POA: Diagnosis present

## 2023-11-06 DIAGNOSIS — M4317 Spondylolisthesis, lumbosacral region: Secondary | ICD-10-CM | POA: Diagnosis present

## 2023-11-06 DIAGNOSIS — Z9682 Presence of neurostimulator: Secondary | ICD-10-CM | POA: Diagnosis not present

## 2023-11-06 DIAGNOSIS — G4730 Sleep apnea, unspecified: Secondary | ICD-10-CM | POA: Diagnosis present

## 2023-11-06 DIAGNOSIS — Z8249 Family history of ischemic heart disease and other diseases of the circulatory system: Secondary | ICD-10-CM | POA: Diagnosis not present

## 2023-11-06 DIAGNOSIS — Z79899 Other long term (current) drug therapy: Secondary | ICD-10-CM | POA: Diagnosis not present

## 2023-11-06 DIAGNOSIS — Z91030 Bee allergy status: Secondary | ICD-10-CM | POA: Diagnosis not present

## 2023-11-06 DIAGNOSIS — M5417 Radiculopathy, lumbosacral region: Secondary | ICD-10-CM | POA: Diagnosis present

## 2023-11-06 DIAGNOSIS — Z800 Family history of malignant neoplasm of digestive organs: Secondary | ICD-10-CM | POA: Diagnosis not present

## 2023-11-06 DIAGNOSIS — I10 Essential (primary) hypertension: Secondary | ICD-10-CM | POA: Diagnosis present

## 2023-11-06 DIAGNOSIS — M4316 Spondylolisthesis, lumbar region: Secondary | ICD-10-CM | POA: Diagnosis present

## 2023-11-06 DIAGNOSIS — Z01812 Encounter for preprocedural laboratory examination: Secondary | ICD-10-CM | POA: Diagnosis not present

## 2023-11-06 DIAGNOSIS — Z825 Family history of asthma and other chronic lower respiratory diseases: Secondary | ICD-10-CM | POA: Diagnosis not present

## 2023-11-06 DIAGNOSIS — Z801 Family history of malignant neoplasm of trachea, bronchus and lung: Secondary | ICD-10-CM | POA: Diagnosis not present

## 2023-11-06 DIAGNOSIS — M5416 Radiculopathy, lumbar region: Secondary | ICD-10-CM | POA: Diagnosis present

## 2023-11-06 MED ORDER — METHOCARBAMOL 500 MG PO TABS: 500.0000 mg | ORAL_TABLET | Freq: Four times a day (QID) | ORAL | 1 refills | Status: AC | PRN

## 2023-11-06 MED ORDER — OXYCODONE HCL 10 MG PO TABS: 10.0000 mg | ORAL_TABLET | ORAL | 0 refills | Status: AC | PRN

## 2023-11-06 NOTE — Evaluation (Signed)
 Occupational Therapy Evaluation and DC Summary  Patient Details Name: Anthony Golden MRN: [id number] DOB: May 20, 1966 Today's Date: 11/06/2023   History of Present Illness Pt is a 58 yo male admitted to [facility] on 2/7 for worsening back and leg pain, dx of spondylolisthesis with stenosis at L3-L4 and L4-L5. Pt is s/p elective PLIF L4-L5 and L5-S1 on 2/7. PMH of OSA, HLD, HTN.   Clinical Impression   Pt admitted for above, educated pt on POB precautions and compensatory strategies for ADLs. Pt return demonstrated good understanding of all education listed, completing ADLs with Mod I and Ind for ambulation in room. Briefly discussed and FCE at outpatient clinics in the future if needed for workers comp or return to work status. Pt has no further acute skilled OT needs, NO post acute OT recommended.        If plan is discharge home, recommend the following: Assistance with cooking/housework    Functional Status Assessment  Patient has not had a recent decline in their functional status  Equipment Recommendations  None recommended by OT    Recommendations for Other Services       Precautions / Restrictions Precautions Precautions: Back Precaution Booklet Issued: Yes (comment) Precaution Comments: Reviewed back precautions Restrictions Weight Bearing Restrictions Per Provider Order: No      Mobility Bed Mobility Overal bed mobility: Modified Independent             General bed mobility comments: Educated pt on log roll, performed a different approach to bed mobility by sitting up then sliding legs and trunk over simultaneously but maintained precautions    Transfers Overall transfer level: Modified independent Equipment used: None                      Balance Overall balance assessment: Mild deficits observed, not formally tested                                         ADL either performed or assessed with clinical judgement   ADL Overall ADL's :  Modified independent                                       General ADL Comments: Educated pt on POB precautions, discussed comenstory strategies for LB ADLs, transfers, and sink strategies. Pt demonstrated understanding of education while maintaining precautions. Discussed sex positions appropriate for back restrictions and pt able to Ind don brace     Vision         Perception         Praxis         Pertinent Vitals/Pain Pain Assessment Pain Assessment: Faces Faces Pain Scale: Hurts little more Pain Location: back Pain Descriptors / Indicators: Aching, Discomfort Pain Intervention(s): Monitored during session, Repositioned     Extremity/Trunk Assessment Upper Extremity Assessment Upper Extremity Assessment: Overall WFL for tasks assessed   Lower Extremity Assessment Lower Extremity Assessment: Defer to PT evaluation   Cervical / Trunk Assessment Cervical / Trunk Assessment: Back Surgery   Communication Communication Communication: No apparent difficulties Cueing Techniques: Verbal cues   Cognition Arousal: Alert Behavior During Therapy: WFL for tasks assessed/performed Overall Cognitive Status: Within Functional Limits for tasks assessed  General Comments       Exercises     Shoulder Instructions      Home Living Family/patient expects to be discharged to:: Private residence Living Arrangements: Spouse/significant other Available Help at Discharge: Family;Available 24 hours/day Type of Home: House Home Access: Stairs to enter [employer] of Steps: 3 Entrance Stairs-Rails: Left Home Layout: Two level;Able to live on main level with bedroom/bathroom     Bathroom Shower/Tub: Tub/shower unit   Bathroom Toilet: Handicapped height     Home Equipment: [employer] (2 wheels);Shower seat;Hand held shower head   Additional Comments: Owns grab bars but they are not  installed      Prior Functioning/Environment Prior Level of Function : Independent/Modified Independent;Working/employed;Driving             Mobility Comments: ind no AD ADLs Comments: ind, 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        OT Problem List: Pain      OT Treatment/Interventions:      OT Goals(Current goals can be found in the care plan section) Acute Rehab OT Goals Patient Stated Goal: To go home OT Goal Formulation: All assessment and education complete, DC therapy Time For Goal Achievement: 11/20/23 Potential to Achieve Goals: Good  OT Frequency:      Co-evaluation              AM-PAC OT 6 Clicks Daily Activity     Outcome Measure Help from another person eating meals?: None Help from another person taking care of personal grooming?: None Help from another person toileting, which includes using toliet, bedpan, or urinal?: None Help from another person bathing (including washing, rinsing, drying)?: None Help from another person to put on and taking off regular upper body clothing?: None Help from another person to put on and taking off regular lower body clothing?: None 6 Click Score: 24   End of Session Equipment Utilized During Treatment: Back brace Nurse Communication: Mobility status  Activity Tolerance: Patient tolerated treatment well Patient left: in bed;with call bell/phone within reach  OT Visit Diagnosis: Pain Pain - Right/Left:  (back)                Time: 9177-9156 OT Time Calculation (min): 21 min Charges:  OT General Charges $OT Visit: 1 Visit OT Evaluation $OT Eval Low Complexity: 1 Low  11/06/2023  AB, OTR/L  Acute Rehabilitation Services  Office: [phone]   Curtistine JONETTA Das 11/06/2023, 9:36 AM

## 2023-11-06 NOTE — Discharge Summary (Signed)
 Physician Discharge Summary  Patient ID: Anthony Golden MRN: [id number] DOB/AGE: [birth year] 58 y.o.  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 The patient tolerated the procedure well and was taken to the recovery room and then to the floor in stable condition. The hospital course was routine. There were no complications. The wound remained clean dry and intact. Pt had appropriate back soreness. No complaints of leg pain or new N/T/W. The patient remained afebrile with stable vital signs, and tolerated a regular diet. The patient continued to increase activities, and pain was well controlled with oral pain medications.   Consults: None  Significant Diagnostic Studies:  Results for orders placed or performed during the hospital encounter of 11/05/23  CBC   Collection Time: 11/05/23 11:11 AM  Result Value Ref Range   WBC 5.9 4.0 - 10.5 K/uL   RBC 4.52 4.22 - 5.81 MIL/uL   Hemoglobin 15.4 13.0 - 17.0 g/dL   HCT 57.2 60.9 - 47.9 %   MCV 94.5 80.0 - 100.0 fL   MCH 34.1 (H) 26.0 - 34.0 pg   MCHC 36.1 (H) 30.0 - 36.0 g/dL   RDW 87.0 88.4 - 84.4 %   Platelets 167 150 - 400 K/uL   nRBC 0.3 (H) 0.0 - 0.2 %  Protime-INR   Collection Time: 11/05/23 11:11 AM  Result Value Ref Range   Prothrombin Time 13.5 11.4 - 15.2 seconds   INR 1.0 0.8 - 1.2  ABO/Rh   Collection Time: 11/05/23 11:17 AM  Result Value Ref Range   ABO/RH(D)      O POS Performed at [facility], [address], [place], [place] [address]     DG Lumbar Spine 2-3 Views Result Date: 11/05/2023 CLINICAL DATA:  Elective surgery. EXAM: LUMBAR SPINE - 2-3 VIEW COMPARISON:  06/17/2023 FINDINGS: Two fluoroscopic spot views of the lumbar spine submitted from  the operating room. Posterior rod with intrapedicular screw fusion L4 through S1. Interbody spacers in place. Fluoroscopy time 83.4 seconds. Dose 64.9 mGy. IMPRESSION: Procedural fluoroscopy during lumbar surgery. Electronically Signed   By: Andrea Gasman M.D.   On: 11/05/2023 18:03   DG C-Arm 1-60 Min-No Report Result Date: 11/05/2023 Fluoroscopy was utilized by the requesting physician.  No radiographic interpretation.   DG C-Arm 1-60 Min-No Report Result Date: 11/05/2023 Fluoroscopy was utilized by the requesting physician.  No radiographic interpretation.   DG C-Arm 1-60 Min-No Report Result Date: 11/05/2023 Fluoroscopy was utilized by the requesting physician.  No radiographic interpretation.    Antibiotics:  Anti-infectives (From admission, onward)    Start     Dose/Rate Route Frequency Ordered Stop   11/05/23 2000  ceFAZolin  (ANCEF ) IVPB 2g/100 mL premix        2 g 200 mL/hr over 30 Minutes Intravenous Every 8 hours 11/05/23 1731 11/06/23 0440   11/05/23 1100  ceFAZolin  (ANCEF ) IVPB 2g/100 mL premix        2 g 200 mL/hr over 30 Minutes Intravenous On call to O.R. 11/05/23 1035 11/05/23 1253   11/05/23 1039  ceFAZolin  (ANCEF ) 2-4 GM/100ML-% IVPB       Note to Pharmacy: Larina Mays A: cabinet override      11/05/23 1039 11/05/23 1346       Discharge Exam:  Blood pressure (!) 142/89, pulse 90, temperature 97.9 F (36.6 C), temperature source Oral, resp. rate 20, height 5' 10 (1.778 m), weight 109.8 kg, SpO2 96%. Neurologic: Grossly normal Dressing dry  Discharge Medications:   Allergies as of 11/06/2023       Reactions   Bee Venom Hives        Medication List     TAKE these medications    acetaminophen  500 MG tablet Commonly known as: TYLENOL  Take 1,000 mg by mouth every 6 (six) hours as needed for mild pain (pain score 1-3) or moderate pain (pain score 4-6).   chlorhexidine  4 % external liquid Commonly known as: HIBICLENS  Apply 15 mLs (1 Application  total) topically as directed for 30 doses. Use as directed daily for 5 days every other week for 6 weeks.   ECHINACEA PO Take 1 capsule by mouth daily.   ibuprofen  200 MG tablet Commonly known as: ADVIL  Take 600 mg by mouth every 6 (six) hours as needed for moderate pain (pain score 4-6).   lisinopril  20 MG tablet Commonly known as: ZESTRIL  Take 20 mg by mouth daily.   methocarbamol  500 MG tablet Commonly known as: ROBAXIN  Take 1 tablet (500 mg total) by mouth every 6 (six) hours as needed for muscle spasms.   multivitamin with minerals Tabs tablet Take 1 tablet by mouth daily.   mupirocin  ointment 2 % Commonly known as: BACTROBAN  Place 1 Application into the nose 2 (two) times daily for 60 doses. Use as directed 2 times daily for 5 days every other week for 6 weeks.   NON FORMULARY Pt uses a cpap nightly   Oxycodone  HCl 10 MG Tabs Take 1 tablet (10 mg total) by mouth every 4 (four) hours as needed for severe pain (pain score 7-10).   pravastatin  40 MG tablet Commonly known as: PRAVACHOL  TAKE 1 TABLET(40 MG) BY MOUTH DAILY   sodium chloride  0.65 % Soln nasal spray Commonly known as: OCEAN Place 1 spray into both nostrils as needed for congestion.   zinc  gluconate 50 MG tablet Take 50 mg by mouth daily.               Durable Medical Equipment  (From admission, onward)           Start     Ordered   11/05/23 1732  DME Walker rolling  Once       Question:  Patient needs a walker to treat with the following condition  Answer:  S/P lumbar fusion   11/05/23 1731   11/05/23 1732  DME 3 n 1  Once        11/05/23 1731            Disposition: home   Final Dx: PLIF L4-5 L5-S1  Discharge Instructions      Remove dressing in 72 hours   Complete by: As directed    Call MD for:  difficulty breathing, headache or visual disturbances   Complete by: As directed    Call MD for:  persistant nausea and vomiting   Complete by: As directed    Call MD for:   redness, tenderness, or signs of infection (pain, swelling, redness, odor or green/yellow discharge around incision site)   Complete by: As directed    Call MD for:  severe uncontrolled pain   Complete by: As directed    Call MD for:  temperature >100.4   Complete by: As directed    Diet - low sodium heart healthy  Complete by: As directed    Increase activity slowly   Complete by: As directed         Follow-up Information     Joshua Alm Hamilton, MD. Schedule an appointment as soon as possible for a visit in 2 week(s).   Specialty: Neurosurgery Contact information: [address] [place] [address] [phone]                  Signed: Alm GORMAN Joshua 11/06/2023, 7:43 AM

## 2023-11-06 NOTE — Evaluation (Signed)
 Physical Therapy Brief Evaluation and Discharge Note Patient Details Name: Anthony Golden MRN: [id number] DOB: [birth year] Today's Date: 11/06/2023   History of Present Illness  Pt is a 58 yo male admitted to [facility] on 2/7 for worsening back and leg pain, dx of spondylolisthesis with stenosis at L3-L4 and L4-L5. Pt is s/p elective PLIF L4-L5 and L5-S1 on 2/7. PMH of OSA, HLD, HTN.  Clinical Impression  Pt s/p above surgery. Mobilizing well and plans for DC home this AM.  I have encouraged the patient to gradually increase activity daily to tolerance.   I have answered all patient's question regarding PT and mobility.    Pt feels safe and ready for DC from a mobility standpoint.       PT Assessment Patient does not need any further PT services  Assistance Needed at Discharge  PRN    Equipment Recommendations None recommended by PT  Recommendations for Other Services       Precautions/Restrictions Precautions Precautions: Back Precaution Booklet Issued: Yes (comment) Precaution Comments: Pt able to teach back 3/3 back precautions Required Braces or Orthoses: Spinal Brace Spinal Brace: Lumbar corset Restrictions Weight Bearing Restrictions Per Provider Order: No        Mobility  Bed Mobility Rolling: Modified independent (Device/Increase time) Supine/Sidelying to sit: Modified independent (Device/Increased time)   General bed mobility comments: instructed in log rolling  Transfers Overall transfer level: Modified independent Equipment used: None               General transfer comment: able to power up without use of UEs    Ambulation/Gait Ambulation/Gait assistance: Modified independent (Device/Increase time) Gait Distance (Feet): 180 Feet Assistive device: None Gait Pattern/deviations: Step-through pattern   General Gait Details: no loss of balance throughout PT session  Home Activity Instructions Home Activity Instructions: increase activity  gradually  Stairs Stairs: Yes Stairs assistance: Supervision Stair Management: One rail Left, Step to pattern, Forwards Number of Stairs: 13 General stair comments: instructed in safest technique  Modified Rankin (Stroke Patients Only)        Balance Overall balance assessment: No apparent balance deficits (not formally assessed)                        Pertinent Vitals/Pain PT - Brief Vital Signs All Vital Signs Stable: Yes Pain Assessment Pain Assessment: 0-10 Pain Score: 8  Pain Location: low back Pain Descriptors / Indicators: Discomfort Pain Intervention(s): Premedicated before session, Monitored during session     Home Living Family/patient expects to be discharged to:: Private residence Living Arrangements: Spouse/significant other Available Help at Discharge: Family;Available PRN/intermittently Home Environment: Stairs to enter  Stairs-Number of Steps: 3 Home Equipment: Agricultural Consultant (2 wheels);Shower seat;Hand held shower head   Additional Comments: Owns grab bars but they are not installed    Prior Function Level of Independence: Independent      UE/LE Assessment   UE ROM/Strength/Tone/Coordination: WFL    LE ROM/Strength/Tone/Coordination: Generalized weakness      Communication   Communication Communication: No apparent difficulties Cueing Techniques: Verbal cues     Cognition Overall Cognitive Status: Appears within functional limits for tasks assessed/performed       General Comments      Exercises     Assessment/Plan    PT Problem List         PT Visit Diagnosis Unsteadiness on feet (R26.81)    No Skilled PT All education completed   Co-evaluation  AMPAC 6 Clicks Help needed turning from your back to your side while in a flat bed without using bedrails?: None Help needed moving from lying on your back to sitting on the side of a flat bed without using bedrails?: None Help needed moving to  and from a bed to a chair (including a wheelchair)?: None Help needed standing up from a chair using your arms (e.g., wheelchair or bedside chair)?: None Help needed to walk in hospital room?: None Help needed climbing 3-5 steps with a railing? : A Little 6 Click Score: 23      End of Session Equipment Utilized During Treatment: Gait belt;Back brace Activity Tolerance: Patient tolerated treatment well Patient left: in bed;with call bell/phone within reach Nurse Communication: Mobility status PT Visit Diagnosis: Unsteadiness on feet (R26.81)     Time: 9095-9079 PT Time Calculation (min) (ACUTE ONLY): 16 min  Charges:   PT Evaluation $PT Eval Low Complexity: 1 Low      Oneil Schick, PT   Acute Rehabilitation Services  Office [phone] 11/06/2023   Schick Oneil PARAS  11/06/2023, 9:51 AM

## 2023-11-06 NOTE — Progress Notes (Signed)
Patient is discharged from room 3C09 at this time. Alert and in stable condition. IV site d/c'd and instructions read to patient with understanding verbalized and all questions answered. Left unit via wheelchair with all belongings at side. 

## 2023-11-06 NOTE — Anesthesia Postprocedure Evaluation (Signed)
 Anesthesia Post Note  Patient: Anthony Golden  Procedure(s) Performed: Posterior Lumbar Interbody Fusion - Lumbar four-Lumbar five - Lumbar five-Sacral one - Posterior Lateral and Interbody fusion (Back) Removal of [employer] multifidus stimulator (Back)     Patient location during evaluation: PACU Anesthesia Type: General Level of consciousness: awake and alert Pain management: pain level controlled Vital Signs Assessment: post-procedure vital signs reviewed and stable Respiratory status: spontaneous breathing, nonlabored ventilation, respiratory function stable and patient connected to nasal cannula oxygen Cardiovascular status: blood pressure returned to baseline and stable Postop Assessment: no apparent nausea or vomiting Anesthetic complications: no   No notable events documented.  Last Vitals:  Vitals:   11/06/23 0349 11/06/23 0717  BP: 115/82 (!) 142/89  Pulse: 82 90  Resp: 18 20  Temp: 36.4 C 36.6 C  SpO2: 97% 96%    Last Pain:  Vitals:   11/06/23 0717  TempSrc: Oral  PainSc:                  [name]

## 2023-11-08 ENCOUNTER — Encounter (HOSPITAL_COMMUNITY): Payer: Self-pay | Admitting: Neurological Surgery

## 2023-11-08 MED FILL — Thrombin For Soln 20000 Unit: CUTANEOUS | Qty: 1 | Status: AC

## 2023-12-06 DIAGNOSIS — G4733 Obstructive sleep apnea (adult) (pediatric): Secondary | ICD-10-CM | POA: Diagnosis not present

## 2024-03-06 DIAGNOSIS — G4733 Obstructive sleep apnea (adult) (pediatric): Secondary | ICD-10-CM | POA: Diagnosis not present

## 2024-04-02 ENCOUNTER — Other Ambulatory Visit: Payer: Self-pay | Admitting: Cardiology

## 2024-06-07 ENCOUNTER — Other Ambulatory Visit: Payer: Self-pay | Admitting: Neurological Surgery

## 2024-06-07 DIAGNOSIS — M4317 Spondylolisthesis, lumbosacral region: Secondary | ICD-10-CM

## 2024-06-21 ENCOUNTER — Ambulatory Visit
Admission: RE | Admit: 2024-06-21 | Discharge: 2024-06-21 | Disposition: A | Discharge status: Home / Self Care | Source: Ambulatory Visit | Attending: Neurological Surgery | Admitting: Neurological Surgery

## 2024-06-21 DIAGNOSIS — M4317 Spondylolisthesis, lumbosacral region: Secondary | ICD-10-CM

## 2024-07-12 ENCOUNTER — Other Ambulatory Visit (HOSPITAL_BASED_OUTPATIENT_CLINIC_OR_DEPARTMENT_OTHER): Payer: Self-pay

## 2024-07-12 ENCOUNTER — Encounter (HOSPITAL_BASED_OUTPATIENT_CLINIC_OR_DEPARTMENT_OTHER): Payer: Self-pay

## 2024-07-12 MED ORDER — ONDANSETRON HCL 4 MG PO TABS
4.0000 mg | ORAL_TABLET | Freq: Three times a day (TID) | ORAL | 0 refills | Status: AC | PRN
  Filled 2024-07-12: qty 30, 10d supply, fill #0
